# Patient Record
Sex: Female | Born: 1945
Health system: Southern US, Community
[De-identification: ages and names within clinical notes are randomized; demographics above are authoritative.]

## PROBLEM LIST (undated history)

## (undated) DIAGNOSIS — K2270 Barrett's esophagus without dysplasia: Secondary | ICD-10-CM

## (undated) DIAGNOSIS — I10 Essential (primary) hypertension: Secondary | ICD-10-CM

## (undated) DIAGNOSIS — T7840XA Allergy, unspecified, initial encounter: Secondary | ICD-10-CM

## (undated) DIAGNOSIS — M8580 Other specified disorders of bone density and structure, unspecified site: Secondary | ICD-10-CM

## (undated) DIAGNOSIS — F329 Major depressive disorder, single episode, unspecified: Secondary | ICD-10-CM

## (undated) DIAGNOSIS — C801 Malignant (primary) neoplasm, unspecified: Secondary | ICD-10-CM

## (undated) DIAGNOSIS — Z83719 Family history of colon polyps, unspecified: Secondary | ICD-10-CM

## (undated) DIAGNOSIS — Z8371 Family history of colonic polyps: Secondary | ICD-10-CM

## (undated) DIAGNOSIS — K219 Gastro-esophageal reflux disease without esophagitis: Secondary | ICD-10-CM

## (undated) DIAGNOSIS — M1990 Unspecified osteoarthritis, unspecified site: Secondary | ICD-10-CM

## (undated) DIAGNOSIS — M109 Gout, unspecified: Secondary | ICD-10-CM

## (undated) DIAGNOSIS — R7989 Other specified abnormal findings of blood chemistry: Secondary | ICD-10-CM

## (undated) DIAGNOSIS — G4730 Sleep apnea, unspecified: Secondary | ICD-10-CM

## (undated) DIAGNOSIS — B019 Varicella without complication: Secondary | ICD-10-CM

## (undated) DIAGNOSIS — E119 Type 2 diabetes mellitus without complications: Secondary | ICD-10-CM

## (undated) DIAGNOSIS — F32A Depression, unspecified: Secondary | ICD-10-CM

## (undated) HISTORY — DX: Depression, unspecified: F32.A

## (undated) HISTORY — PX: ABDOMINAL HYSTERECTOMY: SHX81

## (undated) HISTORY — DX: Other specified abnormal findings of blood chemistry: R79.89

## (undated) HISTORY — DX: Type 2 diabetes mellitus without complications: E11.9

## (undated) HISTORY — DX: Varicella without complication: B01.9

## (undated) HISTORY — DX: Family history of colonic polyps: Z83.71

## (undated) HISTORY — DX: Gastro-esophageal reflux disease without esophagitis: K21.9

## (undated) HISTORY — PX: KNEE ARTHROSCOPY: SUR90

## (undated) HISTORY — DX: Other specified disorders of bone density and structure, unspecified site: M85.80

## (undated) HISTORY — DX: Family history of colon polyps, unspecified: Z83.719

## (undated) HISTORY — DX: Barrett's esophagus without dysplasia: K22.70

## (undated) HISTORY — DX: Gout, unspecified: M10.9

## (undated) HISTORY — PX: COLONOSCOPY: SHX174

## (undated) HISTORY — DX: Allergy, unspecified, initial encounter: T78.40XA

## (undated) HISTORY — DX: Sleep apnea, unspecified: G47.30

## (undated) HISTORY — PX: POLYPECTOMY: SHX149

## (undated) HISTORY — PX: UPPER GASTROINTESTINAL ENDOSCOPY: SHX188

## (undated) HISTORY — DX: Malignant (primary) neoplasm, unspecified: C80.1

## (undated) HISTORY — DX: Essential (primary) hypertension: I10

## (undated) HISTORY — DX: Unspecified osteoarthritis, unspecified site: M19.90

---
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
## 1998-05-10 ENCOUNTER — Other Ambulatory Visit: Admission: RE | Admit: 1998-05-10 | Discharge: 1998-05-10 | Payer: Self-pay | Admitting: Internal Medicine

## 2005-10-22 HISTORY — PX: BREAST BIOPSY: SHX20

## 2014-01-14 DIAGNOSIS — R7401 Elevation of levels of liver transaminase levels: Secondary | ICD-10-CM | POA: Insufficient documentation

## 2014-05-20 DIAGNOSIS — H524 Presbyopia: Secondary | ICD-10-CM | POA: Insufficient documentation

## 2014-05-20 DIAGNOSIS — H2510 Age-related nuclear cataract, unspecified eye: Secondary | ICD-10-CM | POA: Insufficient documentation

## 2014-06-16 DIAGNOSIS — C4491 Basal cell carcinoma of skin, unspecified: Secondary | ICD-10-CM | POA: Insufficient documentation

## 2016-05-03 DIAGNOSIS — G4733 Obstructive sleep apnea (adult) (pediatric): Secondary | ICD-10-CM | POA: Insufficient documentation

## 2017-03-12 DIAGNOSIS — E559 Vitamin D deficiency, unspecified: Secondary | ICD-10-CM | POA: Insufficient documentation

## 2017-03-12 DIAGNOSIS — E538 Deficiency of other specified B group vitamins: Secondary | ICD-10-CM | POA: Insufficient documentation

## 2019-09-23 ENCOUNTER — Telehealth: Payer: Self-pay

## 2019-09-23 NOTE — Telephone Encounter (Signed)
ROI fax to [facility] for patient records.

## 2019-09-28 ENCOUNTER — Other Ambulatory Visit: Payer: Self-pay

## 2019-09-29 ENCOUNTER — Ambulatory Visit: Payer: Medicare Other | Admitting: Internal Medicine

## 2019-09-29 ENCOUNTER — Encounter: Payer: Self-pay | Admitting: Internal Medicine

## 2019-09-29 VITALS — BP 140/78 | HR 76 | Temp 97.8°F | Wt 243.2 lb

## 2019-09-29 DIAGNOSIS — Z1239 Encounter for other screening for malignant neoplasm of breast: Secondary | ICD-10-CM

## 2019-09-29 DIAGNOSIS — E1165 Type 2 diabetes mellitus with hyperglycemia: Secondary | ICD-10-CM | POA: Diagnosis not present

## 2019-09-29 DIAGNOSIS — E785 Hyperlipidemia, unspecified: Secondary | ICD-10-CM

## 2019-09-29 DIAGNOSIS — Z13820 Encounter for screening for osteoporosis: Secondary | ICD-10-CM | POA: Diagnosis not present

## 2019-09-29 DIAGNOSIS — I10 Essential (primary) hypertension: Secondary | ICD-10-CM | POA: Insufficient documentation

## 2019-09-29 DIAGNOSIS — F339 Major depressive disorder, recurrent, unspecified: Secondary | ICD-10-CM | POA: Insufficient documentation

## 2019-09-29 DIAGNOSIS — E1169 Type 2 diabetes mellitus with other specified complication: Secondary | ICD-10-CM | POA: Insufficient documentation

## 2019-09-29 DIAGNOSIS — IMO0002 Reserved for concepts with insufficient information to code with codable children: Secondary | ICD-10-CM | POA: Insufficient documentation

## 2019-09-29 NOTE — Progress Notes (Signed)
New Patient Office Visit     This visit occurred during the SARS-CoV-2 public health emergency.  Safety protocols were in place, including screening questions prior to the visit, additional usage of staff PPE, and extensive cleaning of exam room while observing appropriate contact time as indicated for disinfecting solutions.    CC/Reason for Visit: Establish care, discuss chronic conditions Previous PCP: Dr. Nicola Girt in [place] Last Visit: Earlier this year  HPI: Diane Decker is a 73 y.o. female who is coming in today for the above mentioned reasons. Past Medical History is significant for: Hypertension that has been well controlled, diabetes that per report has been well controlled on Metformin 1000 mg at bedtime and glipizide 5 mg in the morning with a most recent A1c of 6.9, depression on bupropion 150 mg daily with stable mood.  This was started after a grief reaction when her second husband died 5 years ago.  She would like to start weaning this medication.  She also has a history of hyperlipidemia on 20 mg of atorvastatin, she also has a significant family history and personal history of colon polyps and is on an every 2-year colonoscopy schedule.  She has already scheduled an appointment with [facility].  She is requesting a DEXA scan and a mammogram today that she is due for.  She is here to establish care, she is living in [facility] independent living after she moved from [place] recently.  She has allergies to penicillin which cause anaphylaxis, in regards to surgeries she has had a hysterectomy and a left knee arthroscopy, she drinks alcohol only occasionally, is a remote smoker who quit about 15 years ago.  Her family history significant for father with diabetes paternal grandfather with diabetes and a sister with breast cancer.   Past Medical/Surgical History: Past Medical History:  Diagnosis Date  . Allergy   . Arthritis   . Chicken pox   . Depression   . Diabetes  mellitus without complication ([place])   . Family history of polyps in the colon   . Hypertension     Past Surgical History:  Procedure Laterality Date  . ABDOMINAL HYSTERECTOMY    . BREAST BIOPSY  2007  . TONSILLECTOMY  1949    Social History:  reports that she has never smoked. She has never used smokeless tobacco. She reports that she does not drink alcohol or use drugs.  Allergies: Allergies  Allergen Reactions  . Penicillins Anaphylaxis    Family History:  Family History  Problem Relation Age of Onset  . Arthritis Sister   . Depression Sister   . Hyperlipidemia Sister   . Hypertension Sister   . Alcohol abuse Brother   . Arthritis Brother   . Depression Brother   . Hyperlipidemia Brother   . Hypertension Brother   . Cancer Daughter   . COPD Daughter   . Depression Son   . Arthritis Maternal Grandmother   . Depression Maternal Grandmother   . Hyperlipidemia Maternal Grandmother   . Hypertension Maternal Grandmother   . Alcohol abuse Maternal Grandfather   . Arthritis Maternal Grandfather   . Cancer Maternal Grandfather   . Hyperlipidemia Maternal Grandfather   . Hypertension Maternal Grandfather   . Stroke Maternal Grandfather   . Arthritis Paternal Grandmother   . Hyperlipidemia Paternal Grandmother   . Hypertension Paternal Grandmother   . Arthritis Paternal Grandfather   . Alcohol abuse Paternal Grandfather   . Diabetes Paternal Grandfather   . Hyperlipidemia  Paternal Grandfather   . Heart disease Paternal Grandfather   . Hearing loss Paternal Grandfather   . Stroke Paternal Grandfather      Current Outpatient Medications:  .  Aspirin Buf,CaCarb-MgCarb-MgO, 81 MG TABS, Take by mouth., Disp: , Rfl:  .  atenolol (TENORMIN) 50 MG tablet, Take 50 mg by mouth daily., Disp: , Rfl:  .  atorvastatin (LIPITOR) 20 MG tablet, Take 20 mg by mouth daily., Disp: , Rfl:  .  b complex vitamins tablet, Take by mouth., Disp: , Rfl:  .  buPROPion (WELLBUTRIN XL) 150  MG 24 hr tablet, Take 150 mg by mouth every morning., Disp: , Rfl:  .  Calcium Carbonate-Vitamin D (CALCIUM-D PO), Take by mouth., Disp: , Rfl:  .  erythromycin ophthalmic ointment, Place one application into both eyes every 6 (six) hours for 5 days., Disp: , Rfl:  .  glipiZIDE (GLUCOTROL XL) 5 MG 24 hr tablet, Take 5 mg by mouth every morning., Disp: , Rfl:  .  metFORMIN (GLUCOPHAGE) 500 MG tablet, Take 1,000 mg by mouth at bedtime., Disp: , Rfl:  .  Multiple Vitamin (MULTIVITAMIN) capsule, Take by mouth., Disp: , Rfl:  .  traZODone (DESYREL) 150 MG tablet, TAKE 1/2 TABLET IN THE EVENING, Disp: , Rfl:   Review of Systems:  Constitutional: Denies fever, chills, diaphoresis, appetite change and fatigue.  HEENT: Denies photophobia, eye pain, redness, hearing loss, ear pain, congestion, sore throat, rhinorrhea, sneezing, mouth sores, trouble swallowing, neck pain, neck stiffness and tinnitus.   Respiratory: Denies SOB, DOE, cough, chest tightness,  and wheezing.   Cardiovascular: Denies chest pain, palpitations and leg swelling.  Gastrointestinal: Denies nausea, vomiting, abdominal pain, diarrhea, constipation, blood in stool and abdominal distention.  Genitourinary: Denies dysuria, urgency, frequency, hematuria, flank pain and difficulty urinating.  Endocrine: Denies: hot or cold intolerance, sweats, changes in hair or nails, polyuria, polydipsia. Musculoskeletal: Denies myalgias, back pain, joint swelling, arthralgias and gait problem.  Skin: Denies pallor, rash and wound.  Neurological: Denies dizziness, seizures, syncope, weakness, light-headedness, numbness and headaches.  Hematological: Denies adenopathy. Easy bruising, personal or family bleeding history  Psychiatric/Behavioral: Denies suicidal ideation, mood changes, confusion, nervousness, sleep disturbance and agitation    Physical Exam: Vitals:   09/29/19 1414  BP: 140/78  Pulse: 76  Temp: 97.8 F (36.6 C)  TempSrc: Temporal   SpO2: 97%  Weight: 243 lb 3.2 oz (110.3 kg)   There is no height or weight on file to calculate BMI.  Constitutional: NAD, calm, comfortable, obese Eyes: PERRL, lids and conjunctivae normal, wears corrective lenses ENMT: Mucous membranes are moist.  Respiratory: clear to auscultation bilaterally, no wheezing, no crackles. Normal respiratory effort. No accessory muscle use.  Cardiovascular: Regular rate and rhythm, no murmurs / rubs / gallops.  Trace bilateral lower extremity pitting edema. 2+ pedal pulses.   Abdomen: no tenderness, no masses palpated. No hepatosplenomegaly. Bowel sounds positive.  Musculoskeletal: no clubbing / cyanosis. No joint deformity upper and lower extremities. Good ROM, no contractures. Normal muscle tone.  Skin: no rashes, lesions, ulcers. No induration Neurologic: Grossly intact and nonfocal psychiatric: Normal judgment and insight. Alert and oriented x 3. Normal mood.    Impression and Plan:  Uncontrolled type 2 diabetes mellitus with hyperglycemia ([place]) -A1c in office today is 8.2. -She admits to about a 10 to 15 pound weight gain since arriving in [place]. -Will give her a 42-month period of time to try lifestyle modifications, if we cannot get A1c below 7 we may  need to adjust her diabetic medications.  Screening for osteoporosis  - Plan: DG Bone Density  Encounter for screening for malignant neoplasm of breast, unspecified screening modality  - Plan: MM Digital Screening  Essential hypertension -Blood pressure little above goal today. -Have advised blood for blood pressure monitoring and will follow-up at return visit.  Depression, recurrent ([place]) -Stable today, she would like to stop taking Wellbutrin as she feels she no longer needs it. -We have discussed taking Wellbutrin every other day until she sees me next, if doing well at that point, can continue further titration schedule.  Hyperlipidemia associated with type 2 diabetes mellitus (HCC)  -Check lipids next physical, continue atorvastatin.  Morbid obesity ([place]) -Discussed healthy lifestyle, including increased physical activity and better food choices to promote weight loss.     Patient Instructions  -Nice seeing you today!!  -Schedule follow up in 3 months. Work hard on your [employer] and eating healthy.     Lelon Frohlich, MD [facility]

## 2019-09-29 NOTE — Patient Instructions (Signed)
-  Nice seeing you today!!  -Schedule follow up in 3 months. Work hard on your [employer] and eating healthy.

## 2019-09-30 LAB — HM DIABETES EYE EXAM

## 2019-10-12 ENCOUNTER — Ambulatory Visit: Payer: Medicare Other | Admitting: Podiatry

## 2019-10-12 ENCOUNTER — Other Ambulatory Visit: Payer: Self-pay

## 2019-10-12 ENCOUNTER — Other Ambulatory Visit: Payer: Self-pay | Admitting: *Deleted

## 2019-10-12 ENCOUNTER — Encounter: Payer: Self-pay | Admitting: Podiatry

## 2019-10-12 VITALS — BP 164/78 | HR 63 | Temp 97.4°F | Resp 16

## 2019-10-12 DIAGNOSIS — R609 Edema, unspecified: Secondary | ICD-10-CM | POA: Insufficient documentation

## 2019-10-12 DIAGNOSIS — K2270 Barrett's esophagus without dysplasia: Secondary | ICD-10-CM | POA: Insufficient documentation

## 2019-10-12 DIAGNOSIS — G4700 Insomnia, unspecified: Secondary | ICD-10-CM | POA: Insufficient documentation

## 2019-10-12 DIAGNOSIS — L600 Ingrowing nail: Secondary | ICD-10-CM | POA: Diagnosis not present

## 2019-10-12 DIAGNOSIS — D126 Benign neoplasm of colon, unspecified: Secondary | ICD-10-CM | POA: Insufficient documentation

## 2019-10-12 DIAGNOSIS — Z780 Asymptomatic menopausal state: Secondary | ICD-10-CM | POA: Insufficient documentation

## 2019-10-12 DIAGNOSIS — M810 Age-related osteoporosis without current pathological fracture: Secondary | ICD-10-CM | POA: Insufficient documentation

## 2019-10-12 DIAGNOSIS — S99829A Other specified injuries of unspecified foot, initial encounter: Secondary | ICD-10-CM

## 2019-10-12 NOTE — Patient Instructions (Signed)

## 2019-10-12 NOTE — Progress Notes (Signed)
   Subjective:    Patient ID: Diane Decker, female    DOB: [birth year], 73 y.o.   MRN: [id number]  HPI    Review of Systems  All other systems reviewed and are negative.      Objective:   Physical Exam        Assessment & Plan:

## 2019-10-14 NOTE — Progress Notes (Signed)
Subjective:   Patient ID: Diane Decker, female   DOB: 73 y.o.   MRN: [id number]   HPI Patient presents stating that the left hallux nail has become very damaged and loose and that she rammed it into a door and its been very sore.  Occurred yesterday patient is a diabetic under reasonable control and does not smoke   Review of Systems  All other systems reviewed and are negative.       Objective:  Physical Exam Vitals and nursing note reviewed.  Constitutional:      Appearance: She is well-developed.  Pulmonary:     Effort: Pulmonary effort is normal.  Musculoskeletal:        General: Normal range of motion.  Skin:    General: Skin is warm.  Neurological:     Mental Status: She is alert.     Neurovascular status was found to be intact muscle strength was found to be adequate range of motion within normal limits.  Patient Diane Decker traumatized left hallux no longer thickened dystrophic and loose from the bed with no proximal attachment noted.  There is mild redness in the area but no active drainage     Assessment:  Traumatized left hallux nail bed secondary to injury with nail not attached properly to the underlying bed     Plan:  H&P reviewed condition at great length going over different treatment options.  I recommended nail removal allowing new nail to regrow and I explained procedure risk patient wants procedure.  I infiltrated the left hallux 60 mg like Marcaine mixture sterile prep done and using sterile instrumentation I remove the hallux nail flushed and cleaned all tissue and applied sterile dressing.  Gave instructions on soaks and reappoint

## 2019-10-22 ENCOUNTER — Encounter: Payer: Self-pay | Admitting: Internal Medicine

## 2019-11-19 ENCOUNTER — Ambulatory Visit: Payer: Medicare Other

## 2019-12-28 ENCOUNTER — Ambulatory Visit
Admission: RE | Admit: 2019-12-28 | Discharge: 2019-12-28 | Disposition: A | Discharge status: Home / Self Care | Payer: Medicare PPO | Source: Ambulatory Visit | Attending: Internal Medicine | Admitting: Internal Medicine

## 2019-12-28 ENCOUNTER — Other Ambulatory Visit: Payer: Self-pay

## 2019-12-28 DIAGNOSIS — Z1239 Encounter for other screening for malignant neoplasm of breast: Secondary | ICD-10-CM

## 2019-12-28 DIAGNOSIS — Z13820 Encounter for screening for osteoporosis: Secondary | ICD-10-CM

## 2019-12-29 ENCOUNTER — Ambulatory Visit: Payer: Medicare Other | Admitting: Internal Medicine

## 2019-12-31 ENCOUNTER — Other Ambulatory Visit: Payer: Self-pay | Admitting: Family

## 2020-01-06 ENCOUNTER — Ambulatory Visit: Payer: Medicare PPO | Admitting: Internal Medicine

## 2020-01-06 ENCOUNTER — Other Ambulatory Visit: Payer: Self-pay

## 2020-01-06 VITALS — BP 124/84 | HR 67 | Temp 97.2°F | Ht 64.0 in | Wt 248.3 lb

## 2020-01-06 DIAGNOSIS — F339 Major depressive disorder, recurrent, unspecified: Secondary | ICD-10-CM | POA: Diagnosis not present

## 2020-01-06 DIAGNOSIS — M8580 Other specified disorders of bone density and structure, unspecified site: Secondary | ICD-10-CM

## 2020-01-06 DIAGNOSIS — D126 Benign neoplasm of colon, unspecified: Secondary | ICD-10-CM

## 2020-01-06 DIAGNOSIS — E785 Hyperlipidemia, unspecified: Secondary | ICD-10-CM

## 2020-01-06 DIAGNOSIS — E559 Vitamin D deficiency, unspecified: Secondary | ICD-10-CM

## 2020-01-06 DIAGNOSIS — Z1283 Encounter for screening for malignant neoplasm of skin: Secondary | ICD-10-CM

## 2020-01-06 DIAGNOSIS — E1169 Type 2 diabetes mellitus with other specified complication: Secondary | ICD-10-CM

## 2020-01-06 DIAGNOSIS — E1165 Type 2 diabetes mellitus with hyperglycemia: Secondary | ICD-10-CM | POA: Diagnosis not present

## 2020-01-06 DIAGNOSIS — I10 Essential (primary) hypertension: Secondary | ICD-10-CM

## 2020-01-06 DIAGNOSIS — E538 Deficiency of other specified B group vitamins: Secondary | ICD-10-CM

## 2020-01-06 LAB — POCT GLYCOSYLATED HEMOGLOBIN (HGB A1C): Hemoglobin A1C: 9.7 % — AB (ref 4.0–5.6)

## 2020-01-06 MED ORDER — METFORMIN HCL 500 MG PO TABS: 1000.0000 mg | ORAL_TABLET | Freq: Two times a day (BID) | ORAL | 2 refills | Status: DC

## 2020-01-06 NOTE — Progress Notes (Signed)
Established Patient Office Visit     This visit occurred during the SARS-CoV-2 public health emergency.  Safety protocols were in place, including screening questions prior to the visit, additional usage of staff PPE, and extensive cleaning of exam room while observing appropriate contact time as indicated for disinfecting solutions.    CC/Reason for Visit: Follow-up chronic conditions  HPI: Diane Decker is a 74 y.o. female who is coming in today for the above mentioned reasons. Past Medical History is significant for: Hypertension that has been well controlled, Metformin not well controlled with a recent A1c of 8.2 in December, depression on bupropion which she has discontinued with stable mood.  She has a history of hyperlipidemia on atorvastatin.  She has a personal history of colon polyps and is on an every 2-year colonoscopy schedule, needs GI referral.  She had a recent DEXA scan that showed osteopenia.  She has questions about numerous skin moles.   Past Medical/Surgical History: Past Medical History:  Diagnosis Date  . Allergy   . Arthritis   . Chicken pox   . Depression   . Diabetes mellitus without complication ([place])   . Family history of polyps in the colon   . Hypertension     Past Surgical History:  Procedure Laterality Date  . ABDOMINAL HYSTERECTOMY    . BREAST BIOPSY  2007  . TONSILLECTOMY  1949    Social History:  reports that she has never smoked. She has never used smokeless tobacco. She reports that she does not drink alcohol or use drugs.  Allergies: Allergies  Allergen Reactions  . Penicillins Anaphylaxis  . Lisinopril Other (See Comments) and Swelling  . Losartan Potassium Swelling  . Losartan Nausea And Vomiting    Family History:  Family History  Problem Relation Age of Onset  . Arthritis Sister   . Depression Sister   . Hyperlipidemia Sister   . Hypertension Sister   . Alcohol abuse Brother   . Arthritis Brother   .  Depression Brother   . Hyperlipidemia Brother   . Hypertension Brother   . Cancer Daughter   . COPD Daughter   . Depression Son   . Arthritis Maternal Grandmother   . Depression Maternal Grandmother   . Hyperlipidemia Maternal Grandmother   . Hypertension Maternal Grandmother   . Alcohol abuse Maternal Grandfather   . Arthritis Maternal Grandfather   . Cancer Maternal Grandfather   . Hyperlipidemia Maternal Grandfather   . Hypertension Maternal Grandfather   . Stroke Maternal Grandfather   . Arthritis Paternal Grandmother   . Hyperlipidemia Paternal Grandmother   . Hypertension Paternal Grandmother   . Arthritis Paternal Grandfather   . Alcohol abuse Paternal Grandfather   . Diabetes Paternal Grandfather   . Hyperlipidemia Paternal Grandfather   . Heart disease Paternal Grandfather   . Hearing loss Paternal Grandfather   . Stroke Paternal Grandfather      Current Outpatient Medications:  .  Aspirin Buf,CaCarb-MgCarb-MgO, 81 MG TABS, Take by mouth., Disp: , Rfl:  .  atenolol (TENORMIN) 50 MG tablet, Take 50 mg by mouth daily., Disp: , Rfl:  .  atorvastatin (LIPITOR) 20 MG tablet, Take 20 mg by mouth daily., Disp: , Rfl:  .  b complex vitamins tablet, Take by mouth., Disp: , Rfl:  .  Calcium Carbonate-Vitamin D (CALCIUM-D PO), Take by mouth., Disp: , Rfl:  .  erythromycin ophthalmic ointment, Place one application into both eyes every 6 (six) hours for 5 days.,  Disp: , Rfl:  .  glipiZIDE (GLUCOTROL XL) 5 MG 24 hr tablet, Take 5 mg by mouth every morning., Disp: , Rfl:  .  metFORMIN (GLUCOPHAGE) 500 MG tablet, Take 2 tablets (1,000 mg total) by mouth 2 (two) times daily with a meal., Disp: 120 tablet, Rfl: 2 .  Multiple Vitamin (MULTIVITAMIN) capsule, Take by mouth., Disp: , Rfl:  .  traZODone (DESYREL) 150 MG tablet, TAKE 1/2 TABLET IN THE EVENING, Disp: , Rfl:  .  XIIDRA 5 % SOLN, , Disp: , Rfl:   Review of Systems:  Constitutional: Denies fever, chills, diaphoresis,  appetite change and fatigue.  HEENT: Denies photophobia, eye pain, redness, hearing loss, ear pain, congestion, sore throat, rhinorrhea, sneezing, mouth sores, trouble swallowing, neck pain, neck stiffness and tinnitus.   Respiratory: Denies SOB, DOE, cough, chest tightness,  and wheezing.   Cardiovascular: Denies chest pain, palpitations and leg swelling.  Gastrointestinal: Denies nausea, vomiting, abdominal pain, diarrhea, constipation, blood in stool and abdominal distention.  Genitourinary: Denies dysuria, urgency, frequency, hematuria, flank pain and difficulty urinating.  Endocrine: Denies: hot or cold intolerance, sweats, changes in hair or nails, polyuria, polydipsia. Musculoskeletal: Denies myalgias, back pain, joint swelling, arthralgias and gait problem.  Skin: Denies pallor, rash and wound.  Neurological: Denies dizziness, seizures, syncope, weakness, light-headedness, numbness and headaches.  Hematological: Denies adenopathy. Easy bruising, personal or family bleeding history  Psychiatric/Behavioral: Denies suicidal ideation, mood changes, confusion, nervousness, sleep disturbance and agitation    Physical Exam: Vitals:   01/06/20 0940  BP: 124/84  Pulse: 67  Temp: (!) 97.2 F (36.2 C)  TempSrc: Temporal  SpO2: 96%  Weight: 248 lb 4.8 oz (112.6 kg)  Height: 5\' 4"  (1.626 m)    Body mass index is 42.62 kg/m.   Constitutional: NAD, calm, comfortable Eyes: PERRL, lids and conjunctivae normal, wears corrective lenses ENMT: Mucous membranes are moist.  Respiratory: clear to auscultation bilaterally, no wheezing, no crackles. Normal respiratory effort. No accessory muscle use.  Cardiovascular: Regular rate and rhythm, no murmurs / rubs / gallops. No extremity edema. 2+ pedal pulses.  Psychiatric: Normal judgment and insight. Alert and oriented x 3. Normal mood.    Impression and Plan:  Uncontrolled type 2 diabetes mellitus with hyperglycemia (HCC)  A1c has worsened to  9.7 today. -Increase Metformin to 1000 mg twice daily. -We have discussed diet and exercise. -Follow-up in 3 months.    Depression, recurrent ([place]) -She has completely weaned off Wellbutrin, mood is stable.  Morbid obesity ([place]) Body mass index is 42.62 kg/m. -Discussed healthy lifestyle, including increased physical activity and better food choices to promote weight loss.  Hyperlipidemia associated with type 2 diabetes mellitus (HCC) -Continue atorvastatin, check LDL with next physical  Essential hypertension -Well-controlled on current regimen.  Adenomatous polyp of colon, unspecified part of colon -Referral to GI.  Osteopenia, unspecified location -She will maximize calcium and vitamin D.  Repeat DEXA in 2 years.  Vitamin D deficiency B12 deficiency -Check levels when she returns for physical.    Patient Instructions  -Nice seeing you today!!  -Increase metformin to 1000 mg twice daily.  -Work on diet and exercise.  -Get tetanus and shingles vaccines at your pharmacy.  -Referrals to GI and dermatology today.  -Schedule a 3 month follow up.   Diabetes Mellitus and Nutrition, Adult When you have diabetes (diabetes mellitus), it is very important to have healthy eating habits because your blood sugar (glucose) levels are greatly affected by what you eat and drink.  Eating healthy foods in the appropriate amounts, at about the same times every day, can help you:  Control your blood glucose.  Lower your risk of heart disease.  Improve your blood pressure.  Reach or maintain a healthy weight. Every person with diabetes is different, and each person has different needs for a meal plan. Your health care provider may recommend that you work with a diet and nutrition specialist (dietitian) to make a meal plan that is best for you. Your meal plan may vary depending on factors such as:  The calories you need.  The medicines you take.  Your weight.  Your blood  glucose, blood pressure, and cholesterol levels.  Your activity level.  Other health conditions you have, such as heart or kidney disease. How do carbohydrates affect me? Carbohydrates, also called carbs, affect your blood glucose level more than any other type of food. Eating carbs naturally raises the amount of glucose in your blood. Carb counting is a method for keeping track of how many carbs you eat. Counting carbs is important to keep your blood glucose at a healthy level, especially if you use insulin or take certain oral diabetes medicines. It is important to know how many carbs you can safely have in each meal. This is different for every person. Your dietitian can help you calculate how many carbs you should have at each meal and for each snack. Foods that contain carbs include:  Bread, cereal, rice, pasta, and crackers.  Potatoes and corn.  Peas, beans, and lentils.  Milk and yogurt.  Fruit and juice.  Desserts, such as cakes, cookies, ice cream, and candy. How does alcohol affect me? Alcohol can cause a sudden decrease in blood glucose (hypoglycemia), especially if you use insulin or take certain oral diabetes medicines. Hypoglycemia can be a life-threatening condition. Symptoms of hypoglycemia (sleepiness, dizziness, and confusion) are similar to symptoms of having too much alcohol. If your health care provider says that alcohol is safe for you, follow these guidelines:  Limit alcohol intake to no more than 1 drink per day for nonpregnant women and 2 drinks per day for men. One drink equals 12 oz of beer, 5 oz of wine, or 1 oz of hard liquor.  Do not drink on an empty stomach.  Keep yourself hydrated with water, diet soda, or unsweetened iced tea.  Keep in mind that regular soda, juice, and other mixers may contain a lot of sugar and must be counted as carbs. What are tips for following this plan?  Reading food labels  Start by checking the serving size on the  "Nutrition Facts" label of packaged foods and drinks. The amount of calories, carbs, fats, and other nutrients listed on the label is based on one serving of the item. Many items contain more than one serving per package.  Check the total grams (g) of carbs in one serving. You can calculate the number of servings of carbs in one serving by dividing the total carbs by 15. For example, if a food has 30 g of total carbs, it would be equal to 2 servings of carbs.  Check the number of grams (g) of saturated and trans fats in one serving. Choose foods that have low or no amount of these fats.  Check the number of milligrams (mg) of salt (sodium) in one serving. Most people should limit total sodium intake to less than 2,300 mg per day.  Always check the nutrition information of foods labeled as "low-fat" or "  nonfat". These foods may be higher in added sugar or refined carbs and should be avoided.  Talk to your dietitian to identify your daily goals for nutrients listed on the label. Shopping  Avoid buying canned, premade, or processed foods. These foods tend to be high in fat, sodium, and added sugar.  Shop around the outside edge of the grocery store. This includes fresh fruits and vegetables, bulk grains, fresh meats, and fresh dairy. Cooking  Use low-heat cooking methods, such as baking, instead of high-heat cooking methods like deep frying.  Cook using healthy oils, such as olive, canola, or sunflower oil.  Avoid cooking with butter, cream, or high-fat meats. Meal planning  Eat meals and snacks regularly, preferably at the same times every day. Avoid going long periods of time without eating.  Eat foods high in fiber, such as fresh fruits, vegetables, beans, and whole grains. Talk to your dietitian about how many servings of carbs you can eat at each meal.  Eat 4-6 ounces (oz) of lean protein each day, such as lean meat, chicken, fish, eggs, or tofu. One oz of lean protein is equal  to: ? 1 oz of meat, chicken, or fish. ? 1 egg. ?  cup of tofu.  Eat some foods each day that contain healthy fats, such as avocado, nuts, seeds, and fish. Lifestyle  Check your blood glucose regularly.  Exercise regularly as told by your health care provider. This may include: ? 150 minutes of moderate-intensity or vigorous-intensity exercise each week. This could be brisk walking, biking, or water aerobics. ? Stretching and doing strength exercises, such as yoga or weightlifting, at least 2 times a week.  Take medicines as told by your health care provider.  Do not use any products that contain nicotine or tobacco, such as cigarettes and e-cigarettes. If you need help quitting, ask your health care provider.  Work with a Social worker or diabetes educator to identify strategies to manage stress and any emotional and social challenges. Questions to ask a health care provider  Do I need to meet with a diabetes educator?  Do I need to meet with a dietitian?  What number can I call if I have questions?  When are the best times to check my blood glucose? Where to find more information:  American Diabetes Association: diabetes.org  Academy of Nutrition and Dietetics: www.eatright.[employer] of Diabetes and Digestive and Kidney Diseases (NIH): [employer] Summary  A healthy meal plan will help you control your blood glucose and maintain a healthy lifestyle.  Working with a diet and nutrition specialist (dietitian) can help you make a meal plan that is best for you.  Keep in mind that carbohydrates (carbs) and alcohol have immediate effects on your blood glucose levels. It is important to count carbs and to use alcohol carefully. This information is not intended to replace advice given to you by your health care provider. Make sure you discuss any questions you have with your health care provider. Document Revised: 09/20/2017 Document Reviewed: 11/12/2016 Elsevier  Patient Education  2020 [place], MD [facility]

## 2020-01-06 NOTE — Patient Instructions (Signed)
-Nice seeing you today!!  -Increase metformin to 1000 mg twice daily.  -Work on diet and exercise.  -Get tetanus and shingles vaccines at your pharmacy.  -Referrals to GI and dermatology today.  -Schedule a 3 month follow up.   Diabetes Mellitus and Nutrition, Adult When you have diabetes (diabetes mellitus), it is very important to have healthy eating habits because your blood sugar (glucose) levels are greatly affected by what you eat and drink. Eating healthy foods in the appropriate amounts, at about the same times every day, can help you:  Control your blood glucose.  Lower your risk of heart disease.  Improve your blood pressure.  Reach or maintain a healthy weight. Every person with diabetes is different, and each person has different needs for a meal plan. Your health care provider may recommend that you work with a diet and nutrition specialist (dietitian) to make a meal plan that is best for you. Your meal plan may vary depending on factors such as:  The calories you need.  The medicines you take.  Your weight.  Your blood glucose, blood pressure, and cholesterol levels.  Your activity level.  Other health conditions you have, such as heart or kidney disease. How do carbohydrates affect me? Carbohydrates, also called carbs, affect your blood glucose level more than any other type of food. Eating carbs naturally raises the amount of glucose in your blood. Carb counting is a method for keeping track of how many carbs you eat. Counting carbs is important to keep your blood glucose at a healthy level, especially if you use insulin or take certain oral diabetes medicines. It is important to know how many carbs you can safely have in each meal. This is different for every person. Your dietitian can help you calculate how many carbs you should have at each meal and for each snack. Foods that contain carbs include:  Bread, cereal, rice, pasta, and crackers.  Potatoes and  corn.  Peas, beans, and lentils.  Milk and yogurt.  Fruit and juice.  Desserts, such as cakes, cookies, ice cream, and candy. How does alcohol affect me? Alcohol can cause a sudden decrease in blood glucose (hypoglycemia), especially if you use insulin or take certain oral diabetes medicines. Hypoglycemia can be a life-threatening condition. Symptoms of hypoglycemia (sleepiness, dizziness, and confusion) are similar to symptoms of having too much alcohol. If your health care provider says that alcohol is safe for you, follow these guidelines:  Limit alcohol intake to no more than 1 drink per day for nonpregnant women and 2 drinks per day for men. One drink equals 12 oz of beer, 5 oz of wine, or 1 oz of hard liquor.  Do not drink on an empty stomach.  Keep yourself hydrated with water, diet soda, or unsweetened iced tea.  Keep in mind that regular soda, juice, and other mixers may contain a lot of sugar and must be counted as carbs. What are tips for following this plan?  Reading food labels  Start by checking the serving size on the "Nutrition Facts" label of packaged foods and drinks. The amount of calories, carbs, fats, and other nutrients listed on the label is based on one serving of the item. Many items contain more than one serving per package.  Check the total grams (g) of carbs in one serving. You can calculate the number of servings of carbs in one serving by dividing the total carbs by 15. For example, if a food has 30 g  of total carbs, it would be equal to 2 servings of carbs.  Check the number of grams (g) of saturated and trans fats in one serving. Choose foods that have low or no amount of these fats.  Check the number of milligrams (mg) of salt (sodium) in one serving. Most people should limit total sodium intake to less than 2,300 mg per day.  Always check the nutrition information of foods labeled as "low-fat" or "nonfat". These foods may be higher in added sugar or  refined carbs and should be avoided.  Talk to your dietitian to identify your daily goals for nutrients listed on the label. Shopping  Avoid buying canned, premade, or processed foods. These foods tend to be high in fat, sodium, and added sugar.  Shop around the outside edge of the grocery store. This includes fresh fruits and vegetables, bulk grains, fresh meats, and fresh dairy. Cooking  Use low-heat cooking methods, such as baking, instead of high-heat cooking methods like deep frying.  Cook using healthy oils, such as olive, canola, or sunflower oil.  Avoid cooking with butter, cream, or high-fat meats. Meal planning  Eat meals and snacks regularly, preferably at the same times every day. Avoid going long periods of time without eating.  Eat foods high in fiber, such as fresh fruits, vegetables, beans, and whole grains. Talk to your dietitian about how many servings of carbs you can eat at each meal.  Eat 4-6 ounces (oz) of lean protein each day, such as lean meat, chicken, fish, eggs, or tofu. One oz of lean protein is equal to: ? 1 oz of meat, chicken, or fish. ? 1 egg. ?  cup of tofu.  Eat some foods each day that contain healthy fats, such as avocado, nuts, seeds, and fish. Lifestyle  Check your blood glucose regularly.  Exercise regularly as told by your health care provider. This may include: ? 150 minutes of moderate-intensity or vigorous-intensity exercise each week. This could be brisk walking, biking, or water aerobics. ? Stretching and doing strength exercises, such as yoga or weightlifting, at least 2 times a week.  Take medicines as told by your health care provider.  Do not use any products that contain nicotine or tobacco, such as cigarettes and e-cigarettes. If you need help quitting, ask your health care provider.  Work with a Social worker or diabetes educator to identify strategies to manage stress and any emotional and social challenges. Questions to ask a  health care provider  Do I need to meet with a diabetes educator?  Do I need to meet with a dietitian?  What number can I call if I have questions?  When are the best times to check my blood glucose? Where to find more information:  American Diabetes Association: diabetes.org  Academy of Nutrition and Dietetics: www.eatright.[employer] of Diabetes and Digestive and Kidney Diseases (NIH): [employer] Summary  A healthy meal plan will help you control your blood glucose and maintain a healthy lifestyle.  Working with a diet and nutrition specialist (dietitian) can help you make a meal plan that is best for you.  Keep in mind that carbohydrates (carbs) and alcohol have immediate effects on your blood glucose levels. It is important to count carbs and to use alcohol carefully. This information is not intended to replace advice given to you by your health care provider. Make sure you discuss any questions you have with your health care provider. Document Revised: 09/20/2017 Document Reviewed: 11/12/2016 Elsevier Patient Education  2020 Elsevier Inc.  

## 2020-01-14 ENCOUNTER — Telehealth: Payer: Self-pay

## 2020-01-14 NOTE — Telephone Encounter (Signed)
Rec'd from [place] forwarded 28 pages to GI Provider Historical

## 2020-01-18 ENCOUNTER — Telehealth: Payer: Self-pay | Admitting: Gastroenterology

## 2020-01-18 NOTE — Telephone Encounter (Signed)
Dr. Havery Moros, (D.O.D)   We received a referral for this patient  for an adenomatous polyp of colon. We have received her records for your review.  Will you accept this patient?

## 2020-01-19 NOTE — Telephone Encounter (Signed)
I will accept this patient. I am out of the office this week and next week so will not be able to review records during that time but this does not sound urgent and will review when I am back in the office. Thanks

## 2020-01-28 ENCOUNTER — Ambulatory Visit: Payer: Self-pay | Admitting: Internal Medicine

## 2020-01-29 ENCOUNTER — Encounter: Payer: Self-pay | Admitting: Gastroenterology

## 2020-02-03 ENCOUNTER — Encounter: Payer: Self-pay | Admitting: Gastroenterology

## 2020-03-02 ENCOUNTER — Other Ambulatory Visit: Payer: Self-pay

## 2020-03-02 ENCOUNTER — Ambulatory Visit (AMBULATORY_SURGERY_CENTER): Payer: Self-pay | Admitting: *Deleted

## 2020-03-02 VITALS — Temp 97.3°F | Ht 64.0 in | Wt 250.0 lb

## 2020-03-02 DIAGNOSIS — Z86010 Personal history of colonic polyps: Secondary | ICD-10-CM

## 2020-03-02 DIAGNOSIS — K2270 Barrett's esophagus without dysplasia: Secondary | ICD-10-CM

## 2020-03-02 NOTE — Progress Notes (Signed)
No egg or soy allergy known to patient  No issues with past sedation with any surgeries  or procedures, no intubation problems  No diet pills per patient No home 02 use per patient  No blood thinners per patient  Pt denies issues with constipation  No A fib or A flutter  EMMI video sent to pt's e mail   12-16-2019 comp. covid vaccines   Due to the COVID-19 pandemic we are asking patients to follow these guidelines. Please only bring one care partner. Please be aware that your care partner may wait in the car in the parking lot or if they feel like they will be too hot to wait in the car, they may wait in the lobby on the 4th floor. All care partners are required to wear a mask the entire time (we do not have any that we can provide them), they need to practice social distancing, and we will do a Covid check for all patient's and care partners when you arrive. Also we will check their temperature and your temperature. If the care partner waits in their car they need to stay in the parking lot the entire time and we will call them on their cell phone when the patient is ready for discharge so they can bring the car to the front of the building. Also all patient's will need to wear a mask into building.

## 2020-03-08 ENCOUNTER — Ambulatory Visit: Payer: Medicare PPO | Admitting: Gastroenterology

## 2020-03-08 DIAGNOSIS — D0471 Carcinoma in situ of skin of right lower limb, including hip: Secondary | ICD-10-CM | POA: Diagnosis not present

## 2020-03-08 DIAGNOSIS — L570 Actinic keratosis: Secondary | ICD-10-CM | POA: Diagnosis not present

## 2020-03-08 DIAGNOSIS — L821 Other seborrheic keratosis: Secondary | ICD-10-CM | POA: Diagnosis not present

## 2020-03-08 DIAGNOSIS — C44712 Basal cell carcinoma of skin of right lower limb, including hip: Secondary | ICD-10-CM | POA: Diagnosis not present

## 2020-03-08 DIAGNOSIS — L820 Inflamed seborrheic keratosis: Secondary | ICD-10-CM | POA: Diagnosis not present

## 2020-03-14 ENCOUNTER — Other Ambulatory Visit: Payer: Self-pay

## 2020-03-14 ENCOUNTER — Encounter: Payer: Self-pay | Admitting: Gastroenterology

## 2020-03-14 ENCOUNTER — Ambulatory Visit (AMBULATORY_SURGERY_CENTER): Payer: Medicare PPO | Admitting: Gastroenterology

## 2020-03-14 VITALS — BP 155/61 | HR 64 | Resp 16 | Ht 64.0 in | Wt 250.0 lb

## 2020-03-14 DIAGNOSIS — K449 Diaphragmatic hernia without obstruction or gangrene: Secondary | ICD-10-CM

## 2020-03-14 DIAGNOSIS — D124 Benign neoplasm of descending colon: Secondary | ICD-10-CM | POA: Diagnosis not present

## 2020-03-14 DIAGNOSIS — K208 Other esophagitis without bleeding: Secondary | ICD-10-CM | POA: Diagnosis not present

## 2020-03-14 DIAGNOSIS — D123 Benign neoplasm of transverse colon: Secondary | ICD-10-CM

## 2020-03-14 DIAGNOSIS — D120 Benign neoplasm of cecum: Secondary | ICD-10-CM

## 2020-03-14 DIAGNOSIS — K2950 Unspecified chronic gastritis without bleeding: Secondary | ICD-10-CM | POA: Diagnosis not present

## 2020-03-14 DIAGNOSIS — D122 Benign neoplasm of ascending colon: Secondary | ICD-10-CM | POA: Diagnosis not present

## 2020-03-14 DIAGNOSIS — Z86010 Personal history of colonic polyps: Secondary | ICD-10-CM

## 2020-03-14 DIAGNOSIS — D125 Benign neoplasm of sigmoid colon: Secondary | ICD-10-CM | POA: Diagnosis not present

## 2020-03-14 DIAGNOSIS — K2270 Barrett's esophagus without dysplasia: Secondary | ICD-10-CM | POA: Diagnosis not present

## 2020-03-14 MED ORDER — SODIUM CHLORIDE 0.9 % IV SOLN: 500.0000 mL | Freq: Once | INTRAVENOUS | Status: DC

## 2020-03-14 NOTE — Op Note (Signed)
[facility] Patient Name: Diane Decker Procedure Date: 03/14/2020 7:57 AM MRN: [id number] Endoscopist: Remo Lipps P. Havery Moros , MD Age: 74 Referring MD:  Date of Birth: [birth year] Gender: Female Account #: [id number] Procedure:                Colonoscopy Indications:              High risk colon cancer surveillance: Personal                            history of colonic polyps (last colonoscopy 2018                            with numerous adenomas) Medicines:                Monitored Anesthesia Care Procedure:                Pre-Anesthesia Assessment:                           - Prior to the procedure, a History and Physical                            was performed, and patient medications and                            allergies were reviewed. The patient's tolerance of                            previous anesthesia was also reviewed. The risks                            and benefits of the procedure and the sedation                            options and risks were discussed with the patient.                            All questions were answered, and informed consent                            was obtained. Prior Anticoagulants: The patient has                            taken no previous anticoagulant or antiplatelet                            agents. ASA Grade Assessment: III - A patient with                            severe systemic disease. After reviewing the risks                            and benefits, the patient was deemed in  satisfactory condition to undergo the procedure.                           After obtaining informed consent, the colonoscope                            was passed under direct vision. Throughout the                            procedure, the patient's blood pressure, pulse, and                            oxygen saturations were monitored continuously. The                            Colonoscope was introduced  through the anus and                            advanced to the the cecum, identified by                            appendiceal orifice and ileocecal valve. The                            colonoscopy was performed without difficulty. The                            patient tolerated the procedure well. The quality                            of the bowel preparation was good. The ileocecal                            valve, appendiceal orifice, and rectum were                            photographed. Scope In: 8:14:42 AM Scope Out: 8:40:23 AM Scope Withdrawal Time: 0 hours 19 minutes 40 seconds  Total Procedure Duration: 0 hours 25 minutes 41 seconds  Findings:                 The perianal and digital rectal examinations were                            normal.                           Two sessile polyps were found in the cecum. The                            polyps were 2 to 3 mm in size. These polyps were                            removed with a cold snare. Resection and retrieval  were complete.                           A 3 to 4 mm polyp was found in the ascending colon.                            The polyp was sessile. The polyp was removed with a                            cold snare. Resection and retrieval were complete.                           Ten sessile polyps were found in the transverse                            colon. The polyps were 3 to 6 mm in size. These                            polyps were removed with a cold snare. Resection                            and retrieval were complete.                           Four sessile polyps were found in the descending                            colon. The polyps were 3 to 5 mm in size. These                            polyps were removed with a cold snare. Resection                            and retrieval were complete.                           A 4 mm polyp was found in the sigmoid colon. The                             polyp was sessile. The polyp was removed with a                            cold snare. Resection and retrieval were complete.                           A few small-mouthed diverticula were found in the                            sigmoid colon.                           The exam was otherwise without abnormality. Complications:  No immediate complications. Estimated blood loss:                            Minimal. Estimated Blood Loss:     Estimated blood loss was minimal. Impression:               - Two 2 to 3 mm polyps in the cecum, removed with a                            cold snare. Resected and retrieved.                           - One 3 to 4 mm polyp in the ascending colon,                            removed with a cold snare. Resected and retrieved.                           - Ten 3 to 6 mm polyps in the transverse colon,                            removed with a cold snare. Resected and retrieved.                           - Four 3 to 5 mm polyps in the descending colon,                            removed with a cold snare. Resected and retrieved.                           - One 4 mm polyp in the sigmoid colon, removed with                            a cold snare. Resected and retrieved.                           - Diverticulosis in the sigmoid colon.                           - The examination was otherwise normal. Recommendation:           - Patient has a contact number available for                            emergencies. The signs and symptoms of potential                            delayed complications were discussed with the                            patient. Return to normal activities tomorrow.  Written discharge instructions were provided to the                            patient.                           - Resume previous diet.                           - Continue present medications.                           -  Await pathology results.                           - Assuming most of these polyps are adenomatous,                            consideration for genetic testing for polyposis                            syndrome Remo Lipps P. Gilmore List, MD 03/14/2020 8:50:09 AM This report has been signed electronically.

## 2020-03-14 NOTE — Progress Notes (Signed)
A/ox3, pleased with MAC, report to RN 

## 2020-03-14 NOTE — Op Note (Signed)
[facility] Patient Name: Diane Decker Procedure Date: 03/14/2020 7:57 AM MRN: [id number] Endoscopist: Remo Lipps P. Havery Moros , MD Age: 74 Referring MD:  Date of Birth: 24-Apr-1946 Gender: Female Account #: [id number] Procedure:                Upper GI endoscopy Indications:              Follow-up of Barrett's esophagus - reported short                            segment of nondysplastic Barrett's in 2018 Medicines:                Monitored Anesthesia Care Procedure:                Pre-Anesthesia Assessment:                           - Prior to the procedure, a History and Physical                            was performed, and patient medications and                            allergies were reviewed. The patient's tolerance of                            previous anesthesia was also reviewed. The risks                            and benefits of the procedure and the sedation                            options and risks were discussed with the patient.                            All questions were answered, and informed consent                            was obtained. Prior Anticoagulants: The patient has                            taken no previous anticoagulant or antiplatelet                            agents. ASA Grade Assessment: III - A patient with                            severe systemic disease. After reviewing the risks                            and benefits, the patient was deemed in                            satisfactory condition to undergo the procedure.  After obtaining informed consent, the endoscope was                            passed under direct vision. Throughout the                            procedure, the patient's blood pressure, pulse, and                            oxygen saturations were monitored continuously. The                            Endoscope was introduced through the mouth, and   advanced to the second part of duodenum. The upper                            GI endoscopy was accomplished without difficulty.                            The patient tolerated the procedure well. Scope In: Scope Out: Findings:                 Esophagogastric landmarks were identified: the                            Z-line was found at 34 cm, the gastroesophageal                            junction was found at 34 cm and the upper extent of                            the gastric folds was found at 36 cm from the                            incisors.                           A 2 cm hiatal hernia was present.                           The Z-line was irregular with small islands of                            salmon colored mucosa. Biopsies were taken with a                            cold forceps for histology.                           The exam of the esophagus was otherwise normal.                           The entire examined stomach was normal.  The duodenal bulb and second portion of the                            duodenum were normal. Complications:            No immediate complications. Estimated blood loss:                            Minimal. Estimated Blood Loss:     Estimated blood loss was minimal.                           ] Impression:               - Esophagogastric landmarks identified.                           - 2 cm hiatal hernia.                           - Z-line irregular. Biopsied.                           - Normal stomach.                           - Normal duodenal bulb and second portion of the                            duodenum. Recommendation:           - Patient has a contact number available for                            emergencies. The signs and symptoms of potential                            delayed complications were discussed with the                            patient. Return to normal activities tomorrow.                             Written discharge instructions were provided to the                            patient.                           - Resume previous diet.                           - Continue present medications.                           - Await pathology results. Remo Lipps P. Tyreke Kaeser, MD 03/14/2020 8:53:08 AM This report has been signed electronically.

## 2020-03-14 NOTE — Progress Notes (Signed)
Called to room to assist during endoscopic procedure.  Patient ID and intended procedure confirmed with present staff. Received instructions for my participation in the procedure from the performing physician.  

## 2020-03-14 NOTE — Progress Notes (Signed)
Pt's states no medical or surgical changes since previsit or office visit. 

## 2020-03-14 NOTE — Patient Instructions (Signed)
Handouts provided on hiatal hernia, polyps and diverticulosis.  YOU HAD AN ENDOSCOPIC PROCEDURE TODAY AT [facility]:   Refer to the procedure report that was given to you for any specific questions about what was found during the examination.  If the procedure report does not answer your questions, please call your gastroenterologist to clarify.  If you requested that your care partner not be given the details of your procedure findings, then the procedure report has been included in a sealed envelope for you to review at your convenience later.  YOU SHOULD EXPECT: Some feelings of bloating in the abdomen. Passage of more gas than usual.  Walking can help get rid of the air that was put into your GI tract during the procedure and reduce the bloating. If you had a lower endoscopy (such as a colonoscopy or flexible sigmoidoscopy) you may notice spotting of blood in your stool or on the toilet paper. If you underwent a bowel prep for your procedure, you may not have a normal bowel movement for a few days.  Please Note:  You might notice some irritation and congestion in your nose or some drainage.  This is from the oxygen used during your procedure.  There is no need for concern and it should clear up in a day or so.  SYMPTOMS TO REPORT IMMEDIATELY:   Following lower endoscopy (colonoscopy or flexible sigmoidoscopy):  Excessive amounts of blood in the stool  Significant tenderness or worsening of abdominal pains  Swelling of the abdomen that is new, acute  Fever of 100F or higher   Following upper endoscopy (EGD)  Vomiting of blood or coffee ground material  New chest pain or pain under the shoulder blades  Painful or persistently difficult swallowing  New shortness of breath  Fever of 100F or higher  Black, tarry-looking stools  For urgent or emergent issues, a gastroenterologist can be reached at any hour by calling [phone]. Do not use MyChart messaging for urgent  concerns.    DIET:  We do recommend a small meal at first, but then you may proceed to your regular diet.  Drink plenty of fluids but you should avoid alcoholic beverages for 24 hours.  ACTIVITY:  You should plan to take it easy for the rest of today and you should NOT DRIVE or use heavy machinery until tomorrow (because of the sedation medicines used during the test).    FOLLOW UP: Our staff will call the number listed on your records 48-72 hours following your procedure to check on you and address any questions or concerns that you may have regarding the information given to you following your procedure. If we do not reach you, we will leave a message.  We will attempt to reach you two times.  During this call, we will ask if you have developed any symptoms of COVID 19. If you develop any symptoms (ie: fever, flu-like symptoms, shortness of breath, cough etc.) before then, please call [phone].  If you test positive for Covid 19 in the 2 weeks post procedure, please call and report this information to [place].    If any biopsies were taken you will be contacted by phone or by letter within the next 1-3 weeks.  Please call us at [phone] if you have not heard about the biopsies in 3 weeks.    SIGNATURES/CONFIDENTIALITY: You and/or your care partner have signed paperwork which will be entered into your electronic medical record.  These signatures attest  to the fact that that the information above on your After Visit Summary has been reviewed and is understood.  Full responsibility of the confidentiality of this discharge information lies with you and/or your care-partner.

## 2020-03-16 ENCOUNTER — Telehealth: Payer: Self-pay

## 2020-03-16 NOTE — Telephone Encounter (Signed)
  Follow up Call-  Call back number 03/14/2020  Post procedure Call Back phone  # [phone]  Permission to leave phone message Yes  Some recent data might be hidden     Patient questions:  Do you have a fever, pain , or abdominal swelling? No. Pain Score  0 *  Have you tolerated food without any problems? Yes.    Have you been able to return to your normal activities? Yes.    Do you have any questions about your discharge instructions: Diet   No. Medications  No. Follow up visit  No.  Do you have questions or concerns about your Care? No.  Actions: * If pain score is 4 or above: No action needed, pain <4. 1. Have you developed a fever since your procedure? no  2.   Have you had an respiratory symptoms (SOB or cough) since your procedure? no  3.   Have you tested positive for COVID 19 since your procedure no  4.   Have you had any family members/close contacts diagnosed with the COVID 19 since your procedure?  no   If yes to any of these questions please route to Joylene John, RN and Erenest Rasher, RN

## 2020-03-17 DIAGNOSIS — D0471 Carcinoma in situ of skin of right lower limb, including hip: Secondary | ICD-10-CM | POA: Diagnosis not present

## 2020-03-17 DIAGNOSIS — L820 Inflamed seborrheic keratosis: Secondary | ICD-10-CM | POA: Diagnosis not present

## 2020-03-17 DIAGNOSIS — C44712 Basal cell carcinoma of skin of right lower limb, including hip: Secondary | ICD-10-CM | POA: Diagnosis not present

## 2020-03-28 ENCOUNTER — Telehealth: Payer: Self-pay | Admitting: Gastroenterology

## 2020-03-28 DIAGNOSIS — D369 Benign neoplasm, unspecified site: Secondary | ICD-10-CM

## 2020-03-28 NOTE — Telephone Encounter (Signed)
Pt would more information about genetic testing that had been discussed.

## 2020-03-28 NOTE — Telephone Encounter (Signed)
Patient has decided she wants to proceed with genetics.  Referral placed.  She will be contacted by oncology/genetics directly with an appt date and time.

## 2020-04-13 ENCOUNTER — Other Ambulatory Visit: Payer: Self-pay

## 2020-04-14 ENCOUNTER — Encounter: Payer: Self-pay | Admitting: Internal Medicine

## 2020-04-14 ENCOUNTER — Ambulatory Visit: Payer: Medicare PPO | Admitting: Internal Medicine

## 2020-04-14 VITALS — BP 150/90 | HR 69 | Temp 97.6°F | Ht 64.0 in | Wt 246.3 lb

## 2020-04-14 DIAGNOSIS — I10 Essential (primary) hypertension: Secondary | ICD-10-CM

## 2020-04-14 DIAGNOSIS — E1169 Type 2 diabetes mellitus with other specified complication: Secondary | ICD-10-CM

## 2020-04-14 DIAGNOSIS — E1165 Type 2 diabetes mellitus with hyperglycemia: Secondary | ICD-10-CM | POA: Diagnosis not present

## 2020-04-14 DIAGNOSIS — E785 Hyperlipidemia, unspecified: Secondary | ICD-10-CM

## 2020-04-14 LAB — POCT GLYCOSYLATED HEMOGLOBIN (HGB A1C): Hemoglobin A1C: 8 % — AB (ref 4.0–5.6)

## 2020-04-14 MED ORDER — HYDROCHLOROTHIAZIDE 25 MG PO TABS: 25.0000 mg | ORAL_TABLET | Freq: Every day | ORAL | 1 refills | Status: DC

## 2020-04-14 MED ORDER — GLIPIZIDE ER 10 MG PO TB24: 10.0000 mg | ORAL_TABLET | Freq: Every morning | ORAL | 1 refills | Status: DC

## 2020-04-14 NOTE — Patient Instructions (Signed)
-  Nice seeing you today!!  -Increase glipizide (glucotrol) to 10 mg daily.  -Start HCTZ 25 mg daily.  -Schedule follow up in 3 months.

## 2020-04-14 NOTE — Progress Notes (Signed)
Established Patient Office Visit     This visit occurred during the SARS-CoV-2 public health emergency.  Safety protocols were in place, including screening questions prior to the visit, additional usage of staff PPE, and extensive cleaning of exam room while observing appropriate contact time as indicated for disinfecting solutions.    CC/Reason for Visit: Follow-up chronic conditions  HPI: Diane Decker is a 74 y.o. female who is coming in today for the above mentioned reasons. Past Medical History is significant for: Hypertension and has been well controlled, type 2 diabetes that has not been well controlled with recent A1c of 9.7 in March, depression that has been stable.  She also has a history of GERD and hyperlipidemia.  At last visit her Metformin was increased to 1000 mg twice daily in response to her elevated A1c she is here today for follow-up of this.  She would also like me to look at several areas that the dermatologist has treated.  She wants to know what to do with a topical solution that the dermatologist had prescribed.   Past Medical/Surgical History: Past Medical History:  Diagnosis Date  . Allergy   . Arthritis   . Barrett's esophagus   . Cancer ([place])    skin cancer  . Chicken pox   . Depression   . Diabetes mellitus without complication ([place])   . Family history of polyps in the colon   . GERD (gastroesophageal reflux disease)   . Hypertension   . Osteopenia   . Sleep apnea    borderline- no cpap use     Past Surgical History:  Procedure Laterality Date  . ABDOMINAL HYSTERECTOMY    . BREAST BIOPSY  2007  . COLONOSCOPY    . KNEE ARTHROSCOPY Left    ~15 yrs ago   . POLYPECTOMY    . TONSILLECTOMY  1949  . UPPER GASTROINTESTINAL ENDOSCOPY      Social History:  reports that she has quit smoking. She has never used smokeless tobacco. She reports that she does not drink alcohol and does not use drugs.  Allergies: Allergies  Allergen  Reactions  . Penicillins Anaphylaxis  . Whey Protein [Protein] Shortness Of Breath  . Lisinopril Other (See Comments) and Swelling  . Losartan Potassium Swelling  . Losartan Nausea And Vomiting    Family History:  Family History  Problem Relation Age of Onset  . Breast cancer Mother   . Lung cancer Father   . Liver cancer Father   . Arthritis Sister   . Depression Sister   . Hyperlipidemia Sister   . Hypertension Sister   . Alcohol abuse Brother   . Arthritis Brother   . Depression Brother   . Hyperlipidemia Brother   . Hypertension Brother   . Cancer Daughter   . COPD Daughter   . Depression Son   . Arthritis Maternal Grandmother   . Depression Maternal Grandmother   . Hyperlipidemia Maternal Grandmother   . Hypertension Maternal Grandmother   . Stroke Maternal Grandmother   . Alcohol abuse Maternal Grandfather   . Arthritis Maternal Grandfather   . Cancer Maternal Grandfather   . Hyperlipidemia Maternal Grandfather   . Hypertension Maternal Grandfather   . Stroke Maternal Grandfather   . Arthritis Paternal Grandmother   . Hyperlipidemia Paternal Grandmother   . Hypertension Paternal Grandmother   . Arthritis Paternal Grandfather   . Alcohol abuse Paternal Grandfather   . Diabetes Paternal Grandfather   . Hyperlipidemia Paternal Grandfather   .  Heart disease Paternal Grandfather   . Hearing loss Paternal Grandfather   . Stroke Paternal Grandfather   . Colon polyps Neg Hx   . Esophageal cancer Neg Hx   . Rectal cancer Neg Hx   . Stomach cancer Neg Hx      Current Outpatient Medications:  .  aspirin 81 MG chewable tablet, Chew by mouth daily., Disp: , Rfl:  .  Aspirin Buf,CaCarb-MgCarb-MgO, 81 MG TABS, Take by mouth., Disp: , Rfl:  .  atenolol (TENORMIN) 50 MG tablet, Take 50 mg by mouth daily., Disp: , Rfl:  .  atorvastatin (LIPITOR) 20 MG tablet, Take 20 mg by mouth daily., Disp: , Rfl:  .  b complex vitamins tablet, Take by mouth., Disp: , Rfl:  .   buPROPion (WELLBUTRIN SR) 150 MG 12 hr tablet, Take 150 mg by mouth 2 (two) times daily., Disp: , Rfl:  .  Calcium Carbonate-Vitamin D (CALCIUM-D PO), Take by mouth. Caltrate plus D, Disp: , Rfl:  .  Cyanocobalamin (VITAMIN B 12 PO), Take by mouth., Disp: , Rfl:  .  erythromycin ophthalmic ointment, Place one application into both eyes every 6 (six) hours for 5 days., Disp: , Rfl:  .  esomeprazole (NEXIUM) 20 MG capsule, Take 20 mg by mouth daily at 12 noon., Disp: , Rfl:  .  fluorouracil (EFUDEX) 5 % cream, , Disp: , Rfl:  .  glipiZIDE (GLUCOTROL XL) 10 MG 24 hr tablet, Take 1 tablet (10 mg total) by mouth every morning., Disp: 90 tablet, Rfl: 1 .  metFORMIN (GLUCOPHAGE) 500 MG tablet, Take 2 tablets (1,000 mg total) by mouth 2 (two) times daily with a meal., Disp: 120 tablet, Rfl: 2 .  Multiple Vitamin (MULTIVITAMIN) capsule, Take by mouth., Disp: , Rfl:  .  traZODone (DESYREL) 150 MG tablet, TAKE 1/2 TABLET IN THE EVENING, Disp: , Rfl:  .  vitamin E 1000 UNIT capsule, Take 1,000 Units by mouth daily., Disp: , Rfl:  .  XIIDRA 5 % SOLN, , Disp: , Rfl:  .  hydrochlorothiazide (HYDRODIURIL) 25 MG tablet, Take 1 tablet (25 mg total) by mouth daily., Disp: 90 tablet, Rfl: 1  Review of Systems:  Constitutional: Denies fever, chills, diaphoresis, appetite change and fatigue.  HEENT: Denies photophobia, eye pain, redness, hearing loss, ear pain, congestion, sore throat, rhinorrhea, sneezing, mouth sores, trouble swallowing, neck pain, neck stiffness and tinnitus.   Respiratory: Denies SOB, DOE, cough, chest tightness,  and wheezing.   Cardiovascular: Denies chest pain, palpitations and leg swelling.  Gastrointestinal: Denies nausea, vomiting, abdominal pain, diarrhea, constipation, blood in stool and abdominal distention.  Genitourinary: Denies dysuria, urgency, frequency, hematuria, flank pain and difficulty urinating.  Endocrine: Denies: hot or cold intolerance, sweats, changes in hair or nails,  polyuria, polydipsia. Musculoskeletal: Denies myalgias, back pain, joint swelling, arthralgias and gait problem.  Skin: Denies pallor. Neurological: Denies dizziness, seizures, syncope, weakness, light-headedness, numbness and headaches.  Hematological: Denies adenopathy. Easy bruising, personal or family bleeding history  Psychiatric/Behavioral: Denies suicidal ideation, mood changes, confusion, nervousness, sleep disturbance and agitation    Physical Exam: Vitals:   04/14/20 1421  BP: (!) 150/90  Pulse: 69  Temp: 97.6 F (36.4 C)  TempSrc: Temporal  SpO2: 94%  Weight: 246 lb 4.8 oz (111.7 kg)  Height: 5\' 4"  (1.626 m)    Body mass index is 42.28 kg/m.   Constitutional: NAD, calm, comfortable Eyes: PERRL, lids and conjunctivae normal ENMT: Mucous membranes are moist. Respiratory: clear to auscultation bilaterally, no wheezing,  no crackles. Normal respiratory effort. No accessory muscle use.  Cardiovascular: Regular rate and rhythm, no murmurs / rubs / gallops. No extremity edema.   Neurologic: Grossly intact and nonfocal Psychiatric: Normal judgment and insight. Alert and oriented x 3. Normal mood.    Impression and Plan:  Uncontrolled type 2 diabetes mellitus with hyperglycemia (HCC)  -A1c is 8.0 today down from 9.7 3 months ago. -Continue maximum dose Metformin, increase glipizide from 5 to 10 mg daily. -She has been congratulated on her 4 pound weight loss.  Morbid obesity ([place]) -Discussed healthy lifestyle, including increased physical activity and better food choices to promote weight loss.  Essential hypertension -Not well controlled. -She is on atenolol 50 mg daily, add hydrochlorothiazide 25 mg daily.  She will return in 3 months for follow-up.  Hyperlipidemia associated with type 2 diabetes mellitus ([place]) -She will need to have lipids checked when she returns for physical, she is on atorvastatin 20 mg daily.   Patient Instructions  -Nice seeing you  today!!  -Increase glipizide (glucotrol) to 10 mg daily.  -Start HCTZ 25 mg daily.  -Schedule follow up in 3 months.     Lelon Frohlich, MD [facility] at [facility]

## 2020-06-30 ENCOUNTER — Other Ambulatory Visit: Payer: Self-pay | Admitting: Internal Medicine

## 2020-06-30 DIAGNOSIS — E1165 Type 2 diabetes mellitus with hyperglycemia: Secondary | ICD-10-CM

## 2020-07-14 ENCOUNTER — Ambulatory Visit: Payer: Medicare PPO | Admitting: Internal Medicine

## 2020-07-14 ENCOUNTER — Other Ambulatory Visit: Payer: Self-pay

## 2020-07-14 VITALS — BP 120/80 | HR 70 | Temp 98.2°F | Wt 242.5 lb

## 2020-07-14 DIAGNOSIS — E1165 Type 2 diabetes mellitus with hyperglycemia: Secondary | ICD-10-CM

## 2020-07-14 DIAGNOSIS — E785 Hyperlipidemia, unspecified: Secondary | ICD-10-CM

## 2020-07-14 DIAGNOSIS — E1169 Type 2 diabetes mellitus with other specified complication: Secondary | ICD-10-CM | POA: Diagnosis not present

## 2020-07-14 DIAGNOSIS — I10 Essential (primary) hypertension: Secondary | ICD-10-CM

## 2020-07-14 LAB — POCT GLYCOSYLATED HEMOGLOBIN (HGB A1C): Hemoglobin A1C: 8.2 % — AB (ref 4.0–5.6)

## 2020-07-14 MED ORDER — HYDROCHLOROTHIAZIDE 25 MG PO TABS: 25.0000 mg | ORAL_TABLET | Freq: Every day | ORAL | 1 refills | Status: DC

## 2020-07-14 MED ORDER — METFORMIN HCL 500 MG PO TABS: 1000.0000 mg | ORAL_TABLET | Freq: Two times a day (BID) | ORAL | 1 refills | Status: DC

## 2020-07-14 MED ORDER — OZEMPIC (0.25 OR 0.5 MG/DOSE) 2 MG/1.5ML ~~LOC~~ SOPN: 0.5000 mg | PEN_INJECTOR | SUBCUTANEOUS | 3 refills | Status: DC

## 2020-07-14 NOTE — Patient Instructions (Signed)
-  Nice seeing you today!!  -Start ozempic 0.25 mg once a week for 4 weeks and then increase to 0.5 mg.  -Schedule follow up in 3 months.

## 2020-07-14 NOTE — Progress Notes (Signed)
Established Patient Office Visit     This visit occurred during the SARS-CoV-2 public health emergency.  Safety protocols were in place, including screening questions prior to the visit, additional usage of staff PPE, and extensive cleaning of exam room while observing appropriate contact time as indicated for disinfecting solutions.    CC/Reason for Visit: Follow-up chronic medical conditions  HPI: Jonella Redditt is a 74 y.o. female who is coming in today for the above mentioned reasons. Past Medical History is significant for: Hypertension, type 2 diabetes, hyperlipidemia and GERD.  At last visit her glipizide had been increased from 5 to 10 mg in addition to maximal dose Metformin with an A1c of 8.0.  She has lost an extra 4 pounds since last visit.  She is doing well.  She tells me that she is visiting her wife for a month with her sister next week.  She declines flu vaccination.   Past Medical/Surgical History: Past Medical History:  Diagnosis Date  . Allergy   . Arthritis   . Barrett's esophagus   . Cancer ([place])    skin cancer  . Chicken pox   . Depression   . Diabetes mellitus without complication ([place])   . Family history of polyps in the colon   . GERD (gastroesophageal reflux disease)   . Hypertension   . Osteopenia   . Sleep apnea    borderline- no cpap use     Past Surgical History:  Procedure Laterality Date  . ABDOMINAL HYSTERECTOMY    . BREAST BIOPSY  2007  . COLONOSCOPY    . KNEE ARTHROSCOPY Left    ~15 yrs ago   . POLYPECTOMY    . TONSILLECTOMY  1949  . UPPER GASTROINTESTINAL ENDOSCOPY      Social History:  reports that she has quit smoking. She has never used smokeless tobacco. She reports that she does not drink alcohol and does not use drugs.  Allergies: Allergies  Allergen Reactions  . Penicillins Anaphylaxis  . Whey Protein [Protein] Shortness Of Breath  . Lisinopril Other (See Comments) and Swelling  . Losartan Potassium  Swelling  . Losartan Nausea And Vomiting    Family History:  Family History  Problem Relation Age of Onset  . Breast cancer Mother   . Lung cancer Father   . Liver cancer Father   . Arthritis Sister   . Depression Sister   . Hyperlipidemia Sister   . Hypertension Sister   . Alcohol abuse Brother   . Arthritis Brother   . Depression Brother   . Hyperlipidemia Brother   . Hypertension Brother   . Cancer Daughter   . COPD Daughter   . Depression Son   . Arthritis Maternal Grandmother   . Depression Maternal Grandmother   . Hyperlipidemia Maternal Grandmother   . Hypertension Maternal Grandmother   . Stroke Maternal Grandmother   . Alcohol abuse Maternal Grandfather   . Arthritis Maternal Grandfather   . Cancer Maternal Grandfather   . Hyperlipidemia Maternal Grandfather   . Hypertension Maternal Grandfather   . Stroke Maternal Grandfather   . Arthritis Paternal Grandmother   . Hyperlipidemia Paternal Grandmother   . Hypertension Paternal Grandmother   . Arthritis Paternal Grandfather   . Alcohol abuse Paternal Grandfather   . Diabetes Paternal Grandfather   . Hyperlipidemia Paternal Grandfather   . Heart disease Paternal Grandfather   . Hearing loss Paternal Grandfather   . Stroke Paternal Grandfather   . Colon polyps  Neg Hx   . Esophageal cancer Neg Hx   . Rectal cancer Neg Hx   . Stomach cancer Neg Hx      Current Outpatient Medications:  .  aspirin 81 MG chewable tablet, Chew by mouth daily., Disp: , Rfl:  .  Aspirin Buf,CaCarb-MgCarb-MgO, 81 MG TABS, Take by mouth., Disp: , Rfl:  .  atenolol (TENORMIN) 50 MG tablet, Take 50 mg by mouth daily., Disp: , Rfl:  .  atorvastatin (LIPITOR) 20 MG tablet, Take 20 mg by mouth daily., Disp: , Rfl:  .  b complex vitamins tablet, Take by mouth., Disp: , Rfl:  .  Calcium Carbonate-Vitamin D (CALCIUM-D PO), Take by mouth. Caltrate plus D, Disp: , Rfl:  .  Cyanocobalamin (VITAMIN B 12 PO), Take by mouth., Disp: , Rfl:  .   erythromycin ophthalmic ointment, Place one application into both eyes every 6 (six) hours for 5 days., Disp: , Rfl:  .  esomeprazole (NEXIUM) 20 MG capsule, Take 20 mg by mouth daily at 12 noon., Disp: , Rfl:  .  fluorouracil (EFUDEX) 5 % cream, , Disp: , Rfl:  .  glipiZIDE (GLUCOTROL XL) 10 MG 24 hr tablet, Take 1 tablet (10 mg total) by mouth every morning., Disp: 90 tablet, Rfl: 1 .  hydrochlorothiazide (HYDRODIURIL) 25 MG tablet, Take 1 tablet (25 mg total) by mouth daily., Disp: 90 tablet, Rfl: 1 .  metFORMIN (GLUCOPHAGE) 500 MG tablet, Take 2 tablets (1,000 mg total) by mouth 2 (two) times daily with a meal., Disp: 360 tablet, Rfl: 1 .  Multiple Vitamin (MULTIVITAMIN) capsule, Take by mouth., Disp: , Rfl:  .  traZODone (DESYREL) 150 MG tablet, TAKE 1/2 TABLET IN THE EVENING, Disp: , Rfl:  .  vitamin E 1000 UNIT capsule, Take 1,000 Units by mouth daily., Disp: , Rfl:  .  XIIDRA 5 % SOLN, , Disp: , Rfl:  .  buPROPion (WELLBUTRIN SR) 150 MG 12 hr tablet, Take 150 mg by mouth 2 (two) times daily. (Patient not taking: Reported on 07/14/2020), Disp: , Rfl:  .  Semaglutide,0.25 or 0.5MG /DOS, (OZEMPIC, 0.25 OR 0.5 MG/DOSE,) 2 MG/1.5ML SOPN, Inject 0.5 mg into the skin once a week., Disp: 1.5 mL, Rfl: 3  Review of Systems:  Constitutional: Denies fever, chills, diaphoresis, appetite change and fatigue.  HEENT: Denies photophobia, eye pain, redness, hearing loss, ear pain, congestion, sore throat, rhinorrhea, sneezing, mouth sores, trouble swallowing, neck pain, neck stiffness and tinnitus.   Respiratory: Denies SOB, DOE, cough, chest tightness,  and wheezing.   Cardiovascular: Denies chest pain, palpitations and leg swelling.  Gastrointestinal: Denies nausea, vomiting, abdominal pain, diarrhea, constipation, blood in stool and abdominal distention.  Genitourinary: Denies dysuria, urgency, frequency, hematuria, flank pain and difficulty urinating.  Endocrine: Denies: hot or cold intolerance, sweats,  changes in hair or nails, polyuria, polydipsia. Musculoskeletal: Denies myalgias, back pain, joint swelling, arthralgias and gait problem.  Skin: Denies pallor, rash and wound.  Neurological: Denies dizziness, seizures, syncope, weakness, light-headedness, numbness and headaches.  Hematological: Denies adenopathy. Easy bruising, personal or family bleeding history  Psychiatric/Behavioral: Denies suicidal ideation, mood changes, confusion, nervousness, sleep disturbance and agitation    Physical Exam: Vitals:   07/14/20 1403  BP: 120/80  Pulse: 70  Temp: 98.2 F (36.8 C)  TempSrc: Oral  SpO2: 94%  Weight: 242 lb 8 oz (110 kg)    Body mass index is 41.63 kg/m.   Constitutional: NAD, calm, comfortable, obese Eyes: PERRL, lids and conjunctivae normal, wears corrective lenses  ENMT: Mucous membranes are moist. Respiratory: clear to auscultation bilaterally, no wheezing, no crackles. Normal respiratory effort. No accessory muscle use.  Cardiovascular: Regular rate and rhythm, no murmurs / rubs / gallops. No extremity edema.  Neurologic: Grossly intact and nonfocal Psychiatric: Normal judgment and insight. Alert and oriented x 3. Normal mood.    Impression and Plan:  Uncontrolled type 2 diabetes mellitus with hyperglycemia (HCC)  -A1c remains uncontrolled at 8.2. -Continue maximum dose Metformin and glipizide. -Add Ozempic 0.25 mg weekly. -We have discussed diet in detail. -Follow-up in 3 months.  Essential hypertension  -Blood pressures well controlled on current regimen of hydrochlorothiazide 25 mg daily.  Hyperlipidemia associated with type 2 diabetes mellitus ([place]) -She is on Lipitor 20 mg, do not have any recent lipids on file, she will return for physical to have these drawn fasting.  Morbid obesity ([place]) -Discussed healthy lifestyle, including increased physical activity and better food choices to promote weight loss.     Patient Instructions  -Nice seeing you  today!!  -Start ozempic 0.25 mg once a week for 4 weeks and then increase to 0.5 mg.  -Schedule follow up in 3 months.     Lelon Frohlich, MD [facility] at [facility]

## 2020-08-15 ENCOUNTER — Telehealth: Payer: Self-pay | Admitting: *Deleted

## 2020-08-15 DIAGNOSIS — E1165 Type 2 diabetes mellitus with hyperglycemia: Secondary | ICD-10-CM

## 2020-08-15 NOTE — Telephone Encounter (Signed)
Patient called stating Dr Jerilee Hoh gave her samples of ozempic patient states she is down to one shot left. Patient wants to know can this be a prescription or can she get it over the counter. [phone]

## 2020-08-16 NOTE — Telephone Encounter (Signed)
Rx sent on 9/23.

## 2020-08-22 NOTE — Telephone Encounter (Signed)
Patient called in stating that she has been having a lot of side effects from taking the medication Ozempic. She has developed a vision issue, stomach issues (cramping and everything she eats runs through her) she's lost 13 pounds in the last 6 weeks. She wants to know is this normal for taking this medication. She wants the Dr to call her back because she wants to know if she should cont to take this medication or does the Dr want to try something different     Please call and advise

## 2020-08-23 MED ORDER — OZEMPIC (0.25 OR 0.5 MG/DOSE) 2 MG/1.5ML ~~LOC~~ SOPN: 0.5000 mg | PEN_INJECTOR | SUBCUTANEOUS | 3 refills | Status: DC

## 2020-08-23 NOTE — Telephone Encounter (Signed)
Ozempic can cause some nausea, but should be tolerable after first few weeks. Weight loss is typically seen with ozempic. Not sure what these vision issues are, but feel she should be seen by an ophthalmologist. If nausea is too severe, may need to change medication. If she would like to discuss in person, please schedule an OV.

## 2020-08-23 NOTE — Addendum Note (Signed)
Addended by: Westley Hummer B on: 08/23/2020 02:03 PM   Modules accepted: Orders

## 2020-08-23 NOTE — Telephone Encounter (Signed)
Patient is aware and a Rx sent

## 2020-09-01 ENCOUNTER — Telehealth: Payer: Self-pay | Admitting: Internal Medicine

## 2020-09-01 DIAGNOSIS — E1165 Type 2 diabetes mellitus with hyperglycemia: Secondary | ICD-10-CM

## 2020-09-01 NOTE — Telephone Encounter (Signed)
Patient would like a referral to an Endocrinologist

## 2020-09-01 NOTE — Telephone Encounter (Signed)
The patient called wanting to talk to Dr. Jerilee Hoh. I told her that Diane Decker can schedule an appointment but Diane Decker doesn't have anything till the end of the month. The patient said that Diane Decker will be dead by then.   This patient has been having difficulty with taking Semaglutide,0.25 or 0.5MG /DOS, (OZEMPIC, 0.25 OR 0.5 MG/DOSE,) 2 MG/1.5ML SOPN  Diane Decker said that Diane Decker is taking too much or too many diabetic medications and it's causing her morning sickness and Diane Decker's tired of being sick for the past 8 weeks.   Diane Decker would like a call back today  Please advise

## 2020-09-01 NOTE — Telephone Encounter (Signed)
Patient is aware and referral placed 

## 2020-09-01 NOTE — Addendum Note (Signed)
Addended by: Westley Hummer B on: 09/01/2020 01:52 PM   Modules accepted: Orders

## 2020-09-01 NOTE — Telephone Encounter (Signed)
I am ok with endo referral. Please let her know that Ozempic can cause some stomach upset initially that typically gets better over ensuing weeks. It also tends to suppress appetite and can cause some weight loss which is beneficial to a lot of pts with DM 2.

## 2020-09-02 NOTE — Telephone Encounter (Signed)
Ozempic should not cause dizziness. Please schedule OV.

## 2020-09-02 NOTE — Telephone Encounter (Signed)
Pt called and was scheduled a virtual appointment on 09/09/2020 at 4:00 she stated that she is having trouble walking across the floor.

## 2020-09-02 NOTE — Telephone Encounter (Signed)
Patient called in again about the medication and what it is doing to her body and how she is feeling. Patient states she is to take another injection on Monday and she is afraid too because of the dizziness and its hard for her to walk. She says nothing is going away with the symptoms she needs some relief because this is too much. She thinks she is taking too much diabetic medication    Please call and advise

## 2020-09-06 NOTE — Telephone Encounter (Signed)
This needs to be switched to in-person please

## 2020-09-06 NOTE — Telephone Encounter (Signed)
Left message on machine for patient to return our call to change her appointment to in office

## 2020-09-08 ENCOUNTER — Encounter: Payer: Self-pay | Admitting: Internal Medicine

## 2020-09-09 ENCOUNTER — Ambulatory Visit: Payer: Medicare PPO | Admitting: Internal Medicine

## 2020-09-09 ENCOUNTER — Encounter: Payer: Self-pay | Admitting: Internal Medicine

## 2020-09-09 ENCOUNTER — Other Ambulatory Visit: Payer: Self-pay

## 2020-09-09 VITALS — BP 102/70 | HR 80 | Temp 98.5°F | Wt 228.5 lb

## 2020-09-09 DIAGNOSIS — E1165 Type 2 diabetes mellitus with hyperglycemia: Secondary | ICD-10-CM

## 2020-09-09 LAB — POCT GLYCOSYLATED HEMOGLOBIN (HGB A1C): Hemoglobin A1C: 6.6 % — AB (ref 4.0–5.6)

## 2020-09-09 LAB — POCT GLUCOSE (DEVICE FOR HOME USE): POC Glucose: 166 mg/dl — AB (ref 70–99)

## 2020-09-09 NOTE — Progress Notes (Signed)
Established Patient Office Visit     This visit occurred during the SARS-CoV-2 public health emergency.  Safety protocols were in place, including screening questions prior to the visit, additional usage of staff PPE, and extensive cleaning of exam room while observing appropriate contact time as indicated for disinfecting solutions.    CC/Reason for Visit: Dizziness, upset stomach, diarrhea  HPI: Diane Decker is a 74 y.o. female who is coming in today for the above mentioned reasons.  She has a history of uncontrolled type 2 diabetes.  When last seen in September her A1c was 8.2 on maximal dose of glipizide and Metformin.  We decided to place her on Ozempic 0.25 mg that she has been taking since the end of September.  She states this medication has caused terrible stomach upset, nausea, diarrhea.  She states she has been dizzy at times.  Throughout this ordeal she has never checked her blood sugar.  She weighed 243 pounds in September and now weighs 228 pounds.  She has continued to take her Ozempic.   Past Medical/Surgical History: Past Medical History:  Diagnosis Date  . Allergy   . Arthritis   . Barrett's esophagus   . Cancer ([place])    skin cancer  . Chicken pox   . Depression   . Diabetes mellitus without complication ([place])   . Family history of polyps in the colon   . GERD (gastroesophageal reflux disease)   . Hypertension   . Osteopenia   . Sleep apnea    borderline- no cpap use     Past Surgical History:  Procedure Laterality Date  . ABDOMINAL HYSTERECTOMY    . BREAST BIOPSY  2007  . COLONOSCOPY    . KNEE ARTHROSCOPY Left    ~15 yrs ago   . POLYPECTOMY    . TONSILLECTOMY  1949  . UPPER GASTROINTESTINAL ENDOSCOPY      Social History:  reports that she has quit smoking. She has never used smokeless tobacco. She reports that she does not drink alcohol and does not use drugs.  Allergies: Allergies  Allergen Reactions  . Penicillins  Anaphylaxis  . Whey Protein [Protein] Shortness Of Breath  . Lisinopril Other (See Comments) and Swelling  . Losartan Potassium Swelling  . Losartan Nausea And Vomiting    Family History:  Family History  Problem Relation Age of Onset  . Breast cancer Mother   . Lung cancer Father   . Liver cancer Father   . Arthritis Sister   . Depression Sister   . Hyperlipidemia Sister   . Hypertension Sister   . Alcohol abuse Brother   . Arthritis Brother   . Depression Brother   . Hyperlipidemia Brother   . Hypertension Brother   . Cancer Daughter   . COPD Daughter   . Depression Son   . Arthritis Maternal Grandmother   . Depression Maternal Grandmother   . Hyperlipidemia Maternal Grandmother   . Hypertension Maternal Grandmother   . Stroke Maternal Grandmother   . Alcohol abuse Maternal Grandfather   . Arthritis Maternal Grandfather   . Cancer Maternal Grandfather   . Hyperlipidemia Maternal Grandfather   . Hypertension Maternal Grandfather   . Stroke Maternal Grandfather   . Arthritis Paternal Grandmother   . Hyperlipidemia Paternal Grandmother   . Hypertension Paternal Grandmother   . Arthritis Paternal Grandfather   . Alcohol abuse Paternal Grandfather   . Diabetes Paternal Grandfather   . Hyperlipidemia Paternal Grandfather   .  Heart disease Paternal Grandfather   . Hearing loss Paternal Grandfather   . Stroke Paternal Grandfather   . Colon polyps Neg Hx   . Esophageal cancer Neg Hx   . Rectal cancer Neg Hx   . Stomach cancer Neg Hx      Current Outpatient Medications:  .  aspirin 81 MG chewable tablet, Chew by mouth daily., Disp: , Rfl:  .  Aspirin Buf,CaCarb-MgCarb-MgO, 81 MG TABS, Take by mouth., Disp: , Rfl:  .  atenolol (TENORMIN) 50 MG tablet, Take 50 mg by mouth daily., Disp: , Rfl:  .  atorvastatin (LIPITOR) 20 MG tablet, Take 20 mg by mouth daily., Disp: , Rfl:  .  b complex vitamins tablet, Take by mouth., Disp: , Rfl:  .  buPROPion (WELLBUTRIN SR) 150 MG  12 hr tablet, Take 150 mg by mouth 2 (two) times daily. , Disp: , Rfl:  .  Calcium Carbonate-Vitamin D (CALCIUM-D PO), Take by mouth. Caltrate plus D, Disp: , Rfl:  .  Cyanocobalamin (VITAMIN B 12 PO), Take by mouth., Disp: , Rfl:  .  erythromycin ophthalmic ointment, Place one application into both eyes every 6 (six) hours for 5 days., Disp: , Rfl:  .  esomeprazole (NEXIUM) 20 MG capsule, Take 20 mg by mouth daily at 12 noon., Disp: , Rfl:  .  fluorouracil (EFUDEX) 5 % cream, , Disp: , Rfl:  .  glipiZIDE (GLUCOTROL XL) 10 MG 24 hr tablet, Take 1 tablet (10 mg total) by mouth every morning., Disp: 90 tablet, Rfl: 1 .  hydrochlorothiazide (HYDRODIURIL) 25 MG tablet, Take 1 tablet (25 mg total) by mouth daily., Disp: 90 tablet, Rfl: 1 .  Multiple Vitamin (MULTIVITAMIN) capsule, Take by mouth., Disp: , Rfl:  .  traZODone (DESYREL) 150 MG tablet, TAKE 1/2 TABLET IN THE EVENING, Disp: , Rfl:  .  vitamin E 1000 UNIT capsule, Take 1,000 Units by mouth daily., Disp: , Rfl:  .  XIIDRA 5 % SOLN, , Disp: , Rfl:  .  metFORMIN (GLUCOPHAGE) 500 MG tablet, Take 2 tablets (1,000 mg total) by mouth 2 (two) times daily with a meal., Disp: 360 tablet, Rfl: 1  Review of Systems:  Constitutional: Denies fever, chills, diaphoresis, appetite change. HEENT: Denies photophobia, eye pain, redness, hearing loss, ear pain, congestion, sore throat, rhinorrhea, sneezing, mouth sores, trouble swallowing, neck pain, neck stiffness and tinnitus.   Respiratory: Denies SOB, DOE, cough, chest tightness,  and wheezing.   Cardiovascular: Denies chest pain, palpitations and leg swelling.  Gastrointestinal: Denies constipation, blood in stool and abdominal distention.  Genitourinary: Denies dysuria, urgency, frequency, hematuria, flank pain and difficulty urinating.  Endocrine: Denies: hot or cold intolerance, sweats, changes in hair or nails, polyuria, polydipsia. Musculoskeletal: Denies myalgias, back pain, joint swelling,  arthralgias and gait problem.  Skin: Denies pallor, rash and wound.  Neurological: Denies  seizures, syncope, weakness,numbness and headaches.  Hematological: Denies adenopathy. Easy bruising, personal or family bleeding history  Psychiatric/Behavioral: Denies suicidal ideation, mood changes, confusion, nervousness, sleep disturbance and agitation    Physical Exam: Vitals:   09/09/20 1557  BP: 102/70  Pulse: 80  Temp: 98.5 F (36.9 C)  TempSrc: Oral  SpO2: 95%  Weight: 228 lb 8 oz (103.6 kg)    Body mass index is 39.22 kg/m.   Constitutional: NAD, calm, comfortable Eyes: PERRL, lids and conjunctivae normal, wears corrective lenses ENMT: Mucous membranes are moist.  Respiratory: clear to auscultation bilaterally, no wheezing, no crackles. Normal respiratory effort. No accessory muscle  use.  Cardiovascular: Regular rate and rhythm, no murmurs / rubs / gallops. No extremity edema. Neurologic: Grossly intact and nonfocal Psychiatric: Normal judgment and insight. Alert and oriented x 3. Normal mood.    Impression and Plan:  Uncontrolled type 2 diabetes mellitus with hyperglycemia ([place])  -With decrease in A1c from 8.2-6.6, I wonder she has been having some hypoglycemic episodes. -She no longer wants to be on Ozempic, I am okay with discontinuing. -She will follow-up in 3 months. -She will start taking her CBGs routinely and when symptomatic.    Lelon Frohlich, MD [facility]

## 2020-09-14 ENCOUNTER — Telehealth: Payer: Self-pay | Admitting: Internal Medicine

## 2020-09-14 MED ORDER — ACCU-CHEK GUIDE ME W/DEVICE KIT: PACK | 0 refills | Status: DC

## 2020-09-14 NOTE — Telephone Encounter (Signed)
Patient is calling back and stated that the pharmacy hasn't received a prescription for her diabetic machine yet, please advise. [id number] [address], [place]  Phone:  [phone] Fax:  [phone]  CB is [phone]

## 2020-09-14 NOTE — Telephone Encounter (Signed)
Rx sent 

## 2020-09-29 DIAGNOSIS — H5203 Hypermetropia, bilateral: Secondary | ICD-10-CM | POA: Diagnosis not present

## 2020-09-29 DIAGNOSIS — E119 Type 2 diabetes mellitus without complications: Secondary | ICD-10-CM | POA: Diagnosis not present

## 2020-09-29 DIAGNOSIS — H2513 Age-related nuclear cataract, bilateral: Secondary | ICD-10-CM | POA: Diagnosis not present

## 2020-09-29 DIAGNOSIS — H52203 Unspecified astigmatism, bilateral: Secondary | ICD-10-CM | POA: Diagnosis not present

## 2020-09-29 LAB — HM DIABETES EYE EXAM

## 2020-10-03 ENCOUNTER — Encounter: Payer: Self-pay | Admitting: Internal Medicine

## 2020-10-06 ENCOUNTER — Telehealth: Payer: Self-pay | Admitting: Internal Medicine

## 2020-10-06 MED ORDER — ACCU-CHEK GUIDE VI STRP: ORAL_STRIP | 12 refills | Status: DC

## 2020-10-06 NOTE — Telephone Encounter (Signed)
Pt call and stated she need the accu check test strips sent in to  [id number] [place], [place] Phone:  [phone]  Fax:  [phone]

## 2020-10-06 NOTE — Telephone Encounter (Signed)
Refill sent.

## 2020-10-25 ENCOUNTER — Ambulatory Visit: Payer: Medicare PPO | Admitting: Internal Medicine

## 2020-10-26 ENCOUNTER — Ambulatory Visit: Payer: Medicare PPO | Admitting: Internal Medicine

## 2020-10-26 ENCOUNTER — Other Ambulatory Visit: Payer: Self-pay | Admitting: Internal Medicine

## 2020-10-26 DIAGNOSIS — E1165 Type 2 diabetes mellitus with hyperglycemia: Secondary | ICD-10-CM

## 2020-10-26 DIAGNOSIS — I10 Essential (primary) hypertension: Secondary | ICD-10-CM

## 2020-10-26 MED ORDER — GLIPIZIDE ER 10 MG PO TB24: 10.0000 mg | ORAL_TABLET | Freq: Every morning | ORAL | 1 refills | Status: DC

## 2020-10-26 MED ORDER — TRAZODONE HCL 150 MG PO TABS: ORAL_TABLET | ORAL | 1 refills | Status: DC

## 2020-10-26 MED ORDER — ACCU-CHEK SOFTCLIX LANCETS MISC
0 refills | Status: DC
Start: 2020-10-26 — End: 2023-05-01

## 2020-10-26 MED ORDER — HYDROCHLOROTHIAZIDE 25 MG PO TABS: 25.0000 mg | ORAL_TABLET | Freq: Every day | ORAL | 1 refills | Status: DC

## 2020-10-26 MED ORDER — ATENOLOL 50 MG PO TABS
50.0000 mg | ORAL_TABLET | Freq: Every day | ORAL | 1 refills | Status: DC
Start: 2020-10-26 — End: 2021-02-08

## 2020-10-26 MED ORDER — ATORVASTATIN CALCIUM 20 MG PO TABS: 20.0000 mg | ORAL_TABLET | Freq: Every day | ORAL | 1 refills | Status: DC

## 2020-10-26 NOTE — Telephone Encounter (Signed)
Patient needs refills--pt states she is out of some of her meds and needs them asap.  She states the pharmacy has called over here 2 times with no response..  Lancets for glucose monitor Atenolol 50 mg tablet Trazadone 150 mg tablet Atorvastatin 20 mg tablet Glipizide ER 10 mg tablet HCTZ 25 mg tablet  [facility] [address]

## 2020-11-08 LAB — HEMOGLOBIN A1C: Hemoglobin A1C: 6.1

## 2020-11-09 ENCOUNTER — Ambulatory Visit: Payer: Medicare PPO | Admitting: Internal Medicine

## 2020-11-10 ENCOUNTER — Encounter: Payer: Self-pay | Admitting: Internal Medicine

## 2020-11-10 ENCOUNTER — Other Ambulatory Visit: Payer: Self-pay

## 2020-11-10 ENCOUNTER — Ambulatory Visit: Payer: Medicare PPO | Admitting: Internal Medicine

## 2020-11-10 VITALS — BP 120/76 | HR 82 | Ht 64.0 in | Wt 227.5 lb

## 2020-11-10 DIAGNOSIS — E119 Type 2 diabetes mellitus without complications: Secondary | ICD-10-CM

## 2020-11-10 LAB — POCT GLUCOSE (DEVICE FOR HOME USE): POC Glucose: 106 mg/dl — AB (ref 70–99)

## 2020-11-10 MED ORDER — OZEMPIC (0.25 OR 0.5 MG/DOSE) 2 MG/1.5ML ~~LOC~~ SOPN: 0.2500 mg | PEN_INJECTOR | SUBCUTANEOUS | 3 refills | Status: DC

## 2020-11-10 MED ORDER — GLIPIZIDE 5 MG PO TABS: 5.0000 mg | ORAL_TABLET | Freq: Every day | ORAL | 3 refills | Status: DC

## 2020-11-10 MED ORDER — ACCU-CHEK GUIDE VI STRP: ORAL_STRIP | 12 refills | Status: DC

## 2020-11-10 NOTE — Progress Notes (Signed)
Name: Diane Decker [facility]  MRN/ DOB: [id number], [birth year]   Age/ Sex: 75 y.o., female    PCP: Isaac Bliss, Rayford Halsted, MD   Reason for Endocrinology Evaluation: Type 2 Diabetes Mellitus     Date of Initial Endocrinology Visit: 11/10/2020     PATIENT IDENTIFIER: Diane Decker is a 75 y.o. female with a past medical history of T2DM, barrett's esophagus. The patient presented for initial endocrinology clinic visit on 11/10/2020 for consultative assistance with her diabetes management.    HPI: Diane Decker was    Diagnosed with DM  At age 29 Prior Medications tried/Intolerance: Ozempic- GI side effects  Currently checking blood sugars 1x / day,  before breakfast  Hypoglycemia episodes : no               Hemoglobin A1c has ranged from 6.6%  in 2021, peaking at 9.7 %in 2021. Patient required assistance for hypoglycemia: no Patient has required hospitalization within the last 1 year from hyper or hypoglycemia:   In terms of diet, the patient eats 3  meals and 2snacks a day.   Glucose meter: Accu- check   Denies vomiting, nausea  But has occasional loose stools      HOME DIABETES REGIMEN: Glipizide 10 mg XL daily  Metformin 500 mg 2 tabs BID  Ozempic 0.5 mg weekly      Statin: yes  ACE-I/ARB: Intolerant to lisinopril and losartan  Prior Diabetic Education: no    METER DOWNLOAD SUMMARY: Did not bring     DIABETIC COMPLICATIONS: Microvascular complications:    Denies: CKD, retinopathy, neuropathy   Last eye exam: Completed 2021  Macrovascular complications:    Denies: CAD, PVD, CVA   PAST HISTORY: Past Medical History:  Past Medical History:  Diagnosis Date  . Allergy   . Arthritis   . Barrett's esophagus   . Cancer ([place])    skin cancer  . Chicken pox   . Depression   . Diabetes mellitus without complication ([place])   . Family history of polyps in the colon   . GERD (gastroesophageal reflux disease)   . Hypertension    . Osteopenia   . Sleep apnea    borderline- no cpap use    Past Surgical History:  Past Surgical History:  Procedure Laterality Date  . ABDOMINAL HYSTERECTOMY    . BREAST BIOPSY  2007  . COLONOSCOPY    . KNEE ARTHROSCOPY Left    ~15 yrs ago   . POLYPECTOMY    . TONSILLECTOMY  1949  . UPPER GASTROINTESTINAL ENDOSCOPY        Social History:  reports that she has quit smoking. She has never used smokeless tobacco. She reports that she does not drink alcohol and does not use drugs. Family History:  Family History  Problem Relation Age of Onset  . Breast cancer Mother   . Lung cancer Father   . Liver cancer Father   . Arthritis Sister   . Depression Sister   . Hyperlipidemia Sister   . Hypertension Sister   . Alcohol abuse Brother   . Arthritis Brother   . Depression Brother   . Hyperlipidemia Brother   . Hypertension Brother   . Cancer Daughter   . COPD Daughter   . Depression Son   . Arthritis Maternal Grandmother   . Depression Maternal Grandmother   . Hyperlipidemia Maternal Grandmother   . Hypertension Maternal Grandmother   . Stroke Maternal Grandmother   . Alcohol abuse Maternal Grandfather   .  Arthritis Maternal Grandfather   . Cancer Maternal Grandfather   . Hyperlipidemia Maternal Grandfather   . Hypertension Maternal Grandfather   . Stroke Maternal Grandfather   . Arthritis Paternal Grandmother   . Hyperlipidemia Paternal Grandmother   . Hypertension Paternal Grandmother   . Arthritis Paternal Grandfather   . Alcohol abuse Paternal Grandfather   . Diabetes Paternal Grandfather   . Hyperlipidemia Paternal Grandfather   . Heart disease Paternal Grandfather   . Hearing loss Paternal Grandfather   . Stroke Paternal Grandfather   . Colon polyps Neg Hx   . Esophageal cancer Neg Hx   . Rectal cancer Neg Hx   . Stomach cancer Neg Hx      HOME MEDICATIONS: Allergies as of 11/10/2020      Reactions   Penicillins Anaphylaxis   Whey Protein [protein]  Shortness Of Breath   Lisinopril Other (See Comments), Swelling   Losartan Potassium Swelling   Losartan Nausea And Vomiting      Medication List       Accurate as of November 10, 2020  2:32 PM. If you have any questions, ask your nurse or doctor.        STOP taking these medications   b complex vitamins tablet Stopped by: Dorita Sciara, MD   buPROPion 150 MG 12 hr tablet Commonly known as: WELLBUTRIN SR Stopped by: Dorita Sciara, MD     TAKE these medications   Accu-Chek Guide Me w/Device Kit Use daily for glucose control . Dx E11.9   Accu-Chek Guide test strip Generic drug: glucose blood Test once daily for glucose control.  Dx E11.9   Accu-Chek Softclix Lancets lancets Use once daily Dx E11.9   aspirin 81 MG chewable tablet Chew by mouth daily.   Aspirin Buf(CaCarb-MgCarb-MgO) 81 MG Tabs Take by mouth.   atenolol 50 MG tablet Commonly known as: TENORMIN Take 1 tablet (50 mg total) by mouth daily.   atorvastatin 20 MG tablet Commonly known as: LIPITOR Take 1 tablet (20 mg total) by mouth daily.   CALCIUM-D PO Take by mouth. Caltrate plus D   erythromycin ophthalmic ointment Place one application into both eyes every 6 (six) hours for 5 days.   esomeprazole 20 MG capsule Commonly known as: NEXIUM Take 20 mg by mouth daily at 12 noon.   fluorouracil 5 % cream Commonly known as: EFUDEX   glipiZIDE 10 MG 24 hr tablet Commonly known as: GLUCOTROL XL Take 1 tablet (10 mg total) by mouth every morning.   hydrochlorothiazide 25 MG tablet Commonly known as: HYDRODIURIL Take 1 tablet (25 mg total) by mouth daily.   metFORMIN 500 MG tablet Commonly known as: GLUCOPHAGE Take 2 tablets (1,000 mg total) by mouth 2 (two) times daily with a meal.   multivitamin capsule Take by mouth.   traZODone 150 MG tablet Commonly known as: DESYREL TAKE 1/2 TABLET IN THE EVENING   VITAMIN B 12 PO Take by mouth.   vitamin E 1000 UNIT capsule Take  1,000 Units by mouth daily.   Xiidra 5 % Soln Generic drug: Lifitegr[employer]        ALLERGIES: Allergies  Allergen Reactions  . Penicillins Anaphylaxis  . Whey Protein [Protein] Shortness Of Breath  . Lisinopril Other (See Comments) and Swelling  . Losartan Potassium Swelling  . Losartan Nausea And Vomiting     REVIEW OF SYSTEMS: A comprehensive ROS was conducted with the patient and is negative except as per HPI    OBJECTIVE:  VITAL SIGNS: BP 120/76   Pulse 82   Ht 5' 4" (1.626 m)   Wt 227 lb 8 oz (103.2 kg)   SpO2 98%   BMI 39.05 kg/m    PHYSICAL EXAM:  General: Pt appears well and is in NAD  Neck: General: Supple without adenopathy or carotid bruits. Thyroid: Thyroid size normal.  No goiter or nodules appreciated.  Lungs: Clear with good BS bilat with no rales, rhonchi, or wheezes  Heart: RRR with normal S1 and S2 and no gallops; no murmurs; no rub  Abdomen: Normoactive bowel sounds, soft, nontender, without masses or organomegaly palpable  Extremities:  Lower extremities -trace  pretibial edema. No lesions.  Skin: Normal texture and temperature to palpation. No rash noted.  Neuro: MS is good with appropriate affect, pt is alert and Ox3    DM foot exam: 11/10/2020  The skin of the feet is intact without sores or ulcerations. The pedal pulses are 2+ on right and 2+ on left. The sensation is intact to a screening 5.07, 10 gram monofilament bilaterally   DATA REVIEWED:  Lab Results  Component Value Date   HGBA1C 6.6 (A) 09/09/2020   HGBA1C 8.2 (A) 07/14/2020   HGBA1C 8.0 (A) 04/14/2020     ASSESSMENT / PLAN / RECOMMENDATIONS:   1) Type 2 Diabetes Mellitus, Optimally controlled, Without complications - Most recent A1c of 6.6 %. Goal A1c < 7.0 %.    Plan: GENERAL: I have discussed with the patient the pathophysiology of diabetes. We went over the natural progression of the disease. I explained the complications associated with diabetes including  retinopathy, nephropathy, neuropathy as well as increased risk of cardiovascular disease.  She was on Ozempic 0.5 mg and developed severe GI symptoms, and was advised to stop by her PCP but her daughter accidentally picked another prescription of Ozempic and she would like to give it another try. Pt will start and remain on 0.25 mg weekly dose . She was advised to stop it immediately should vomiting or worsening diarrhea occur Will change glipizide from extended release to regular release and reduce the dose as below I offered to order labs but she is under the impression this will be done with her next appointment at PCP's office  Pt encouraged to bring meter on next visit    MEDICATIONS: - STOP Glipizide 10 mg XL  - Start Glipizide 5 mg, 1 tablet before Breakfast  - Continue Metformin 500 mg , 2 tablets with Breakfast and 2 tablet with supper  - Restart Ozempic 0.25 mg once weekly    EDUCATION / INSTRUCTIONS:  BG monitoring instructions: Patient is instructed to check her blood sugars 1 times a day, fasting .  Call [facility] if: BG persistently < 70  . I reviewed the Rule of 15 for the treatment of hypoglycemia in detail with the patient. Literature supplied.   2) Diabetic complications:   Eye: Does not have known diabetic retinopathy.   Neuro/ Feet: Does not have known diabetic peripheral neuropathy.  Renal: Patient does not have known baseline CKD. She is intolerant to ACEI/ARB .  3) Lipids: Patient is on statins    I spent 45 minutes preparing to see the patient by review of available records, obtaining and reviewing separately obtained history, communicating with the patient, ordering medications, and documenting clinical information in the EHR including the differential Dx, treatment, and any further evaluation and other management   F/U in 3 months     Signed  electronically by: Mack Guise, MD  [facility] End[facility]  [address] [place], [place] [address] Phone: [phone] FAX: [phone]   CC: Isaac Bliss, Rayford Halsted, MD [place] [address] Phone: [phone]  Fax: [phone]    Return to Endocrinology clinic as below: Future Appointments  Date Time Provider [place]  12/13/2020  1:30 PM Isaac Bliss, Rayford Halsted, MD [facility]  01/24/2021 10:00 AM Isaac Bliss, Rayford Halsted, MD [facility]

## 2020-11-10 NOTE — Patient Instructions (Signed)
-   STOP Glipizide 10 mg XL  - Start Glipizide 5 mg, 1 tablet before Breakfast  - Continue Metformin 500 mg , 2 tablets with Breakfast and 2 tablet with supper  - Restart Ozempic 0.25 mg once weekly ( DO NOT INCREASE please)    - Stop Ozempic if you have vomiting, or worsening diarrhea      HOW TO TREAT LOW BLOOD SUGARS (Blood sugar LESS THAN 70 MG/DL)  Please follow the RULE OF 15 for the treatment of hypoglycemia treatment (when your (blood sugars are less than 70 mg/dL)    STEP 1: Take 15 grams of carbohydrates when your blood sugar is low, which includes:   3-4 GLUCOSE TABS  OR  3-4 OZ OF JUICE OR REGULAR SODA OR  ONE TUBE OF GLUCOSE GEL     STEP 2: RECHECK blood sugar in 15 MINUTES STEP 3: If your blood sugar is still low at the 15 minute recheck --> then, go back to STEP 1 and treat AGAIN with another 15 grams of carbohydrates.

## 2020-12-13 ENCOUNTER — Ambulatory Visit: Payer: Medicare PPO | Admitting: Internal Medicine

## 2020-12-19 ENCOUNTER — Telehealth: Payer: Self-pay | Admitting: Internal Medicine

## 2020-12-19 ENCOUNTER — Other Ambulatory Visit: Payer: Self-pay | Admitting: Internal Medicine

## 2020-12-19 ENCOUNTER — Other Ambulatory Visit: Payer: Self-pay

## 2020-12-19 MED ORDER — OZEMPIC (0.25 OR 0.5 MG/DOSE) 2 MG/1.5ML ~~LOC~~ SOPN: 0.2500 mg | PEN_INJECTOR | SUBCUTANEOUS | 2 refills | Status: DC

## 2020-12-19 MED ORDER — OZEMPIC (0.25 OR 0.5 MG/DOSE) 2 MG/1.5ML ~~LOC~~ SOPN: 0.2500 mg | PEN_INJECTOR | SUBCUTANEOUS | 4 refills | Status: DC

## 2020-12-19 NOTE — Telephone Encounter (Deleted)
[facility]

## 2020-12-19 NOTE — Telephone Encounter (Signed)
Ok to sent refill? Last seen on 11/10/2020.

## 2020-12-19 NOTE — Telephone Encounter (Signed)
REFILL FOR:  ozempic   PHARMACY:  [id number] [address] [place], [place] [address]  Phone:  [phone] Fax:  [phone]

## 2020-12-19 NOTE — Addendum Note (Signed)
Addended by: Jacqualin Combes on: 12/19/2020 10:41 AM   Modules accepted: Orders

## 2020-12-20 ENCOUNTER — Encounter: Payer: Self-pay | Admitting: Internal Medicine

## 2020-12-20 ENCOUNTER — Ambulatory Visit (INDEPENDENT_AMBULATORY_CARE_PROVIDER_SITE_OTHER): Payer: Medicare PPO | Admitting: Internal Medicine

## 2020-12-20 VITALS — BP 130/80 | HR 74 | Temp 98.4°F | Wt 223.0 lb

## 2020-12-20 DIAGNOSIS — E538 Deficiency of other specified B group vitamins: Secondary | ICD-10-CM

## 2020-12-20 DIAGNOSIS — F339 Major depressive disorder, recurrent, unspecified: Secondary | ICD-10-CM | POA: Diagnosis not present

## 2020-12-20 DIAGNOSIS — E785 Hyperlipidemia, unspecified: Secondary | ICD-10-CM | POA: Diagnosis not present

## 2020-12-20 DIAGNOSIS — E559 Vitamin D deficiency, unspecified: Secondary | ICD-10-CM | POA: Diagnosis not present

## 2020-12-20 DIAGNOSIS — E1165 Type 2 diabetes mellitus with hyperglycemia: Secondary | ICD-10-CM | POA: Diagnosis not present

## 2020-12-20 DIAGNOSIS — E1169 Type 2 diabetes mellitus with other specified complication: Secondary | ICD-10-CM | POA: Diagnosis not present

## 2020-12-20 LAB — TSH: TSH: 1.88 u[IU]/mL (ref 0.35–4.50)

## 2020-12-20 LAB — CBC WITH DIFFERENTIAL/PLATELET
Basophils Absolute: 0 10*3/uL (ref 0.0–0.1)
Basophils Relative: 0.3 % (ref 0.0–3.0)
Eosinophils Absolute: 0.2 10*3/uL (ref 0.0–0.7)
Eosinophils Relative: 2.7 % (ref 0.0–5.0)
HCT: 40 % (ref 36.0–46.0)
Hemoglobin: 13.5 g/dL (ref 12.0–15.0)
Lymphocytes Relative: 34.6 % (ref 12.0–46.0)
Lymphs Abs: 3 10*3/uL (ref 0.7–4.0)
MCHC: 33.8 g/dL (ref 30.0–36.0)
MCV: 90.9 fl (ref 78.0–100.0)
Monocytes Absolute: 0.7 10*3/uL (ref 0.1–1.0)
Monocytes Relative: 7.8 % (ref 3.0–12.0)
Neutro Abs: 4.7 10*3/uL (ref 1.4–7.7)
Neutrophils Relative %: 54.6 % (ref 43.0–77.0)
Platelets: 236 10*3/uL (ref 150.0–400.0)
RBC: 4.4 Mil/uL (ref 3.87–5.11)
RDW: 12.9 % (ref 11.5–15.5)
WBC: 8.6 10*3/uL (ref 4.0–10.5)

## 2020-12-20 LAB — COMPREHENSIVE METABOLIC PANEL
ALT: 32 U/L (ref 0–35)
AST: 40 U/L — ABNORMAL HIGH (ref 0–37)
Albumin: 4.2 g/dL (ref 3.5–5.2)
Alkaline Phosphatase: 70 U/L (ref 39–117)
BUN: 13 mg/dL (ref 6–23)
CO2: 28 mEq/L (ref 19–32)
Calcium: 9.8 mg/dL (ref 8.4–10.5)
Chloride: 99 mEq/L (ref 96–112)
Creatinine, Ser: 0.92 mg/dL (ref 0.40–1.20)
GFR: 60.27 mL/min (ref >60.00)
Glucose, Bld: 66 mg/dL — ABNORMAL LOW (ref 70–99)
Potassium: 4.5 mEq/L (ref 3.5–5.1)
Sodium: 139 mEq/L (ref 135–145)
Total Bilirubin: 0.5 mg/dL (ref 0.2–1.2)
Total Protein: 7 g/dL (ref 6.0–8.3)

## 2020-12-20 LAB — LIPID PANEL
Cholesterol: 121 mg/dL (ref 0–200)
HDL: 52.1 mg/dL (ref >39.00)
LDL Cholesterol: 42 mg/dL (ref 0–99)
NonHDL: 68.86
Total CHOL/HDL Ratio: 2
Triglycerides: 135 mg/dL (ref 0.0–149.0)
VLDL: 27 mg/dL (ref 0.0–40.0)

## 2020-12-20 LAB — VITAMIN D 25 HYDROXY (VIT D DEFICIENCY, FRACTURES): VITD: 35.46 ng/mL (ref 30.00–100.00)

## 2020-12-20 LAB — VITAMIN B12: Vitamin B-12: 282 pg/mL (ref 211–911)

## 2020-12-20 NOTE — Patient Instructions (Signed)
-  Nice seeing you today!!  -Lab work today; will notify you once results are available.  -Schedule follow up in 4 months for your physical.

## 2020-12-20 NOTE — Progress Notes (Signed)
   Established Patient Office Visit     This visit occurred during the SARS-CoV-2 public health emergency.  Safety protocols were in place, including screening questions prior to the visit, additional usage of staff PPE, and extensive cleaning of exam room while observing appropriate contact time as indicated for disinfecting solutions.    CC/Reason for Visit: Follow-up chronic conditions  HPI: Diane Decker is a 75 y.o. female who is coming in today for the above mentioned reasons. Past Medical History is significant for: Hypertension, hyperlipidemia, GERD, type 2 diabetes.  She has started seeing an endocrinologist and her most recent A1c was 6.1.  She continues to lose weight which is fantastic.  She has had all of her Covid vaccinations.  Her blood pressure remains well controlled.  She has no acute concerns today.   Past Medical/Surgical History: Past Medical History:  Diagnosis Date  . Allergy   . Arthritis   . Barrett's esophagus   . Cancer (HCC)    skin cancer  . Chicken pox   . Depression   . Diabetes mellitus without complication (HCC)   . Family history of polyps in the colon   . GERD (gastroesophageal reflux disease)   . Hypertension   . Osteopenia   . Sleep apnea    borderline- no cpap use     Past Surgical History:  Procedure Laterality Date  . ABDOMINAL HYSTERECTOMY    . BREAST BIOPSY  2007  . COLONOSCOPY    . KNEE ARTHROSCOPY Left    ~15 yrs ago   . POLYPECTOMY    . TONSILLECTOMY  1949  . UPPER GASTROINTESTINAL ENDOSCOPY      Social History:  reports that she has quit smoking. She has never used smokeless tobacco. She reports that she does not drink alcohol and does not use drugs.  Allergies: Allergies  Allergen Reactions  . Penicillins Anaphylaxis  . Whey Protein [Protein] Shortness Of Breath  . Lisinopril Other (See Comments) and Swelling  . Losartan Potassium Swelling  . Losartan Nausea And Vomiting    Family History:   Family History  Problem Relation Age of Onset  . Breast cancer Mother   . Lung cancer Father   . Liver cancer Father   . Arthritis Sister   . Depression Sister   . Hyperlipidemia Sister   . Hypertension Sister   . Alcohol abuse Brother   . Arthritis Brother   . Depression Brother   . Hyperlipidemia Brother   . Hypertension Brother   . Cancer Daughter   . COPD Daughter   . Depression Son   . Arthritis Maternal Grandmother   . Depression Maternal Grandmother   . Hyperlipidemia Maternal Grandmother   . Hypertension Maternal Grandmother   . Stroke Maternal Grandmother   . Alcohol abuse Maternal Grandfather   . Arthritis Maternal Grandfather   . Cancer Maternal Grandfather   . Hyperlipidemia Maternal Grandfather   . Hypertension Maternal Grandfather   . Stroke Maternal Grandfather   . Arthritis Paternal Grandmother   . Hyperlipidemia Paternal Grandmother   . Hypertension Paternal Grandmother   . Arthritis Paternal Grandfather   . Alcohol abuse Paternal Grandfather   . Diabetes Paternal Grandfather   . Hyperlipidemia Paternal Grandfather   . Heart disease Paternal Grandfather   . Hearing loss Paternal Grandfather   . Stroke Paternal Grandfather   . Colon polyps Neg Hx   . Esophageal cancer Neg Hx   . Rectal cancer Neg Hx   . Stomach   cancer Neg Hx      Current Outpatient Medications:  .  Accu-Chek Softclix Lancets lancets, Use once daily Dx E11.9, Disp: 100 each, Rfl: 0 .  aspirin 81 MG chewable tablet, Chew by mouth daily., Disp: , Rfl:  .  Aspirin Buf,CaCarb-MgCarb-MgO, 81 MG TABS, Take by mouth., Disp: , Rfl:  .  atenolol (TENORMIN) 50 MG tablet, Take 1 tablet (50 mg total) by mouth daily., Disp: 90 tablet, Rfl: 1 .  atorvastatin (LIPITOR) 20 MG tablet, Take 1 tablet (20 mg total) by mouth daily., Disp: 90 tablet, Rfl: 1 .  Blood Glucose Monitoring Suppl (ACCU-CHEK GUIDE ME) w/Device KIT, Use daily for glucose control . Dx E11.9, Disp: 1 kit, Rfl: 0 .  Calcium  Carbonate-Vitamin D (CALCIUM-D PO), Take by mouth. Caltrate plus D, Disp: , Rfl:  .  Cyanocobalamin (VITAMIN B 12 PO), Take by mouth., Disp: , Rfl:  .  erythromycin ophthalmic ointment, Place one application into both eyes every 6 (six) hours for 5 days., Disp: , Rfl:  .  esomeprazole (NEXIUM) 20 MG capsule, Take 20 mg by mouth daily at 12 noon., Disp: , Rfl:  .  glipiZIDE (GLUCOTROL) 5 MG tablet, Take 1 tablet (5 mg total) by mouth daily before breakfast., Disp: 90 tablet, Rfl: 3 .  hydrochlorothiazide (HYDRODIURIL) 25 MG tablet, Take 1 tablet (25 mg total) by mouth daily., Disp: 90 tablet, Rfl: 1 .  metFORMIN (GLUCOPHAGE) 500 MG tablet, Take 2 tablets (1,000 mg total) by mouth 2 (two) times daily with a meal., Disp: 360 tablet, Rfl: 1 .  Multiple Vitamin (MULTIVITAMIN) capsule, Take by mouth., Disp: , Rfl:  .  Semaglutide,0.25 or 0.5MG/DOS, (OZEMPIC, 0.25 OR 0.5 MG/DOSE,) 2 MG/1.5ML SOPN, Inject 0.25 mg into the skin once a week., Disp: 3 mL, Rfl: 4 .  traZODone (DESYREL) 150 MG tablet, TAKE 1/2 TABLET IN THE EVENING, Disp: 45 tablet, Rfl: 1 .  vitamin E 1000 UNIT capsule, Take 1,000 Units by mouth daily., Disp: , Rfl:  .  XIIDRA 5 % SOLN, , Disp: , Rfl:  .  glucose blood (ACCU-CHEK GUIDE) test strip, Test once daily for glucose control.  Dx E11.9, Disp: 100 each, Rfl: 12  Review of Systems:  Constitutional: Denies fever, chills, diaphoresis, appetite change and fatigue.  HEENT: Denies photophobia, eye pain, redness, hearing loss, ear pain, congestion, sore throat, rhinorrhea, sneezing, mouth sores, trouble swallowing, neck pain, neck stiffness and tinnitus.   Respiratory: Denies SOB, DOE, cough, chest tightness,  and wheezing.   Cardiovascular: Denies chest pain, palpitations and leg swelling.  Gastrointestinal: Denies nausea, vomiting, abdominal pain, diarrhea, constipation, blood in stool and abdominal distention.  Genitourinary: Denies dysuria, urgency, frequency, hematuria, flank pain and  difficulty urinating.  Endocrine: Denies: hot or cold intolerance, sweats, changes in hair or nails, polyuria, polydipsia. Musculoskeletal: Denies myalgias, back pain, joint swelling, arthralgias and gait problem.  Skin: Denies pallor, rash and wound.  Neurological: Denies dizziness, seizures, syncope, weakness, light-headedness, numbness and headaches.  Hematological: Denies adenopathy. Easy bruising, personal or family bleeding history  Psychiatric/Behavioral: Denies suicidal ideation, mood changes, confusion, nervousness, sleep disturbance and agitation    Physical Exam: Vitals:   12/20/20 1136  BP: 130/80  Pulse: 74  Temp: 98.4 F (36.9 C)  TempSrc: Oral  SpO2: 96%  Weight: 223 lb (101.2 kg)    Body mass index is 38.28 kg/m.   Constitutional: NAD, calm, comfortable, obese Eyes: PERRL, lids and conjunctivae normal, wears corrective lenses ENMT: Mucous membranes are moist. Respiratory: clear   to auscultation bilaterally, no wheezing, no crackles. Normal respiratory effort. No accessory muscle use.  Cardiovascular: Regular rate and rhythm, no murmurs / rubs / gallops. No extremity edema.   Neurologic: Grossly intact and nonfocal Psychiatric: Normal judgment and insight. Alert and oriented x 3. Normal mood.    Impression and Plan:  Uncontrolled type 2 diabetes mellitus with hyperglycemia (HCC)  -Well-controlled with a recent A1c of 6.1, she is now followed by endocrinology.  Hyperlipidemia associated with type 2 diabetes mellitus (HCC)  - Plan: Lipid panel -She is on atorvastatin 20 mg daily, no recent LDL measurement.  Morbid obesity (HCC) -Discussed healthy lifestyle, including increased physical activity and better food choices to promote weight loss.  Vitamin D deficiency - Plan: VITAMIN D 25 Hydroxy (Vit-D Deficiency, Fractures)  B12 deficiency  - Plan: Vitamin B12  Depression, recurrent (HCC)  - Plan: TSH   Patient Instructions  -Nice seeing you  today!!  -Lab work today; will notify you once results are available.  -Schedule follow up in 4 months for your physical.     Estela Hernandez Acosta, MD [facility]   

## 2021-01-20 ENCOUNTER — Other Ambulatory Visit: Payer: Self-pay

## 2021-01-20 ENCOUNTER — Ambulatory Visit: Payer: Medicare PPO | Admitting: Internal Medicine

## 2021-01-20 ENCOUNTER — Encounter: Payer: Self-pay | Admitting: Internal Medicine

## 2021-01-20 VITALS — BP 110/74 | HR 72 | Temp 97.2°F | Wt 226.2 lb

## 2021-01-20 DIAGNOSIS — H9193 Unspecified hearing loss, bilateral: Secondary | ICD-10-CM

## 2021-01-20 NOTE — Progress Notes (Signed)
Acute office Visit     This visit occurred during the SARS-CoV-2 public health emergency.  Safety protocols were in place, including screening questions prior to the visit, additional usage of staff PPE, and extensive cleaning of exam room while observing appropriate contact time as indicated for disinfecting solutions.    CC/Reason for Visit: Bilateral hearing loss  HPI: Diane Decker is a 75 y.o. female who is coming in today for the above mentioned reasons.  She is here today accompanied by her daughter.  Her daughter made the appointment concerns regarding her hearing.  They were at a restaurant a few weeks ago and her mother could not hear her from across the booth due to the ambient noise in the restaurant.  Patient has been using hearing aids from her husband who has passed.  She feels like the hearing aids are not effective for her.  Past Medical/Surgical History: Past Medical History:  Diagnosis Date  . Allergy   . Arthritis   . Barrett's esophagus   . Cancer ([place])    skin cancer  . Chicken pox   . Depression   . Diabetes mellitus without complication ([place])   . Family history of polyps in the colon   . GERD (gastroesophageal reflux disease)   . Hypertension   . Osteopenia   . Sleep apnea    borderline- no cpap use     Past Surgical History:  Procedure Laterality Date  . ABDOMINAL HYSTERECTOMY    . BREAST BIOPSY  2007  . COLONOSCOPY    . KNEE ARTHROSCOPY Left    ~15 yrs ago   . POLYPECTOMY    . TONSILLECTOMY  1949  . UPPER GASTROINTESTINAL ENDOSCOPY      Social History:  reports that she has quit smoking. She has never used smokeless tobacco. She reports that she does not drink alcohol and does not use drugs.  Allergies: Allergies  Allergen Reactions  . Penicillins Anaphylaxis  . Whey Protein [Protein] Shortness Of Breath  . Lisinopril Other (See Comments) and Swelling  . Losartan Potassium Swelling  . Losartan Nausea And Vomiting     Family History:  Family History  Problem Relation Age of Onset  . Breast cancer Mother   . Lung cancer Father   . Liver cancer Father   . Arthritis Sister   . Depression Sister   . Hyperlipidemia Sister   . Hypertension Sister   . Alcohol abuse Brother   . Arthritis Brother   . Depression Brother   . Hyperlipidemia Brother   . Hypertension Brother   . Cancer Daughter   . COPD Daughter   . Depression Son   . Arthritis Maternal Grandmother   . Depression Maternal Grandmother   . Hyperlipidemia Maternal Grandmother   . Hypertension Maternal Grandmother   . Stroke Maternal Grandmother   . Alcohol abuse Maternal Grandfather   . Arthritis Maternal Grandfather   . Cancer Maternal Grandfather   . Hyperlipidemia Maternal Grandfather   . Hypertension Maternal Grandfather   . Stroke Maternal Grandfather   . Arthritis Paternal Grandmother   . Hyperlipidemia Paternal Grandmother   . Hypertension Paternal Grandmother   . Arthritis Paternal Grandfather   . Alcohol abuse Paternal Grandfather   . Diabetes Paternal Grandfather   . Hyperlipidemia Paternal Grandfather   . Heart disease Paternal Grandfather   . Hearing loss Paternal Grandfather   . Stroke Paternal Grandfather   . Colon polyps Neg Hx   . Esophageal cancer  Neg Hx   . Rectal cancer Neg Hx   . Stomach cancer Neg Hx      Current Outpatient Medications:  .  Accu-Chek Softclix Lancets lancets, Use once daily Dx E11.9, Disp: 100 each, Rfl: 0 .  aspirin 81 MG chewable tablet, Chew by mouth daily., Disp: , Rfl:  .  Aspirin Buf,CaCarb-MgCarb-MgO, 81 MG TABS, Take by mouth., Disp: , Rfl:  .  atenolol (TENORMIN) 50 MG tablet, Take 1 tablet (50 mg total) by mouth daily., Disp: 90 tablet, Rfl: 1 .  atorvastatin (LIPITOR) 20 MG tablet, Take 1 tablet (20 mg total) by mouth daily., Disp: 90 tablet, Rfl: 1 .  Blood Glucose Monitoring Suppl (ACCU-CHEK GUIDE ME) w/Device KIT, Use daily for glucose control . Dx E11.9, Disp: 1 kit,  Rfl: 0 .  Calcium Carbonate-Vitamin D (CALCIUM-D PO), Take by mouth. Caltrate plus D, Disp: , Rfl:  .  Cyanocobalamin (VITAMIN B 12 PO), Take by mouth., Disp: , Rfl:  .  erythromycin ophthalmic ointment, Place one application into both eyes every 6 (six) hours for 5 days., Disp: , Rfl:  .  esomeprazole (NEXIUM) 20 MG capsule, Take 20 mg by mouth daily at 12 noon., Disp: , Rfl:  .  glipiZIDE (GLUCOTROL) 5 MG tablet, Take 1 tablet (5 mg total) by mouth daily before breakfast., Disp: 90 tablet, Rfl: 3 .  glucose blood (ACCU-CHEK GUIDE) test strip, Test once daily for glucose control.  Dx E11.9, Disp: 100 each, Rfl: 12 .  hydrochlorothiazide (HYDRODIURIL) 25 MG tablet, Take 1 tablet (25 mg total) by mouth daily., Disp: 90 tablet, Rfl: 1 .  Multiple Vitamin (MULTIVITAMIN) capsule, Take by mouth., Disp: , Rfl:  .  Semaglutide,0.25 or 0.5MG/DOS, (OZEMPIC, 0.25 OR 0.5 MG/DOSE,) 2 MG/1.5ML SOPN, Inject 0.25 mg into the skin once a week., Disp: 3 mL, Rfl: 4 .  traZODone (DESYREL) 150 MG tablet, TAKE 1/2 TABLET IN THE EVENING, Disp: 45 tablet, Rfl: 1 .  vitamin E 1000 UNIT capsule, Take 1,000 Units by mouth daily., Disp: , Rfl:  .  XIIDRA 5 % SOLN, , Disp: , Rfl:  .  metFORMIN (GLUCOPHAGE) 500 MG tablet, Take 2 tablets (1,000 mg total) by mouth 2 (two) times daily with a meal., Disp: 360 tablet, Rfl: 1  Review of Systems:  Constitutional: Denies fever, chills, diaphoresis, appetite change and fatigue.  HEENT: Denies photophobia, eye pain, redness,  ear pain, congestion, sore throat, rhinorrhea, sneezing, mouth sores, trouble swallowing, neck pain, neck stiffness and tinnitus.   Respiratory: Denies SOB, DOE, cough, chest tightness,  and wheezing.   Cardiovascular: Denies chest pain, palpitations and leg swelling.  Gastrointestinal: Denies nausea, vomiting, abdominal pain, diarrhea, constipation, blood in stool and abdominal distention.  Genitourinary: Denies dysuria, urgency, frequency, hematuria, flank  pain and difficulty urinating.  Endocrine: Denies: hot or cold intolerance, sweats, changes in hair or nails, polyuria, polydipsia. Musculoskeletal: Denies myalgias, back pain, joint swelling, arthralgias and gait problem.  Skin: Denies pallor, rash and wound.  Neurological: Denies dizziness, seizures, syncope, weakness, light-headedness, numbness and headaches.  Hematological: Denies adenopathy. Easy bruising, personal or family bleeding history  Psychiatric/Behavioral: Denies suicidal ideation, mood changes, confusion, nervousness, sleep disturbance and agitation    Physical Exam: Vitals:   01/20/21 1519  BP: 110/74  Pulse: 72  Temp: (!) 97.2 F (36.2 C)  TempSrc: Oral  SpO2: 97%  Weight: 226 lb 3.2 oz (102.6 kg)    Body mass index is 38.83 kg/m.   Constitutional: NAD, calm, comfortable Eyes: PERRL,  lids and conjunctivae normal, wears corrective lenses ENMT: Mucous membranes are moist.Tympanic membrane is pearly white, no erythema or bulging bilaterally. Neurologic: Grossly intact and nonfocal Psychiatric: Normal judgment and insight. Alert and oriented x 3. Normal mood.    Impression and Plan:  Bilateral hearing loss, unspecified hearing loss type -No evidence cerumen impaction on exam. -I will refer to audiology for formal hearing test and for her to be fitted for hearing aids if needed.     Lelon Frohlich, MD [facility]

## 2021-01-24 ENCOUNTER — Ambulatory Visit: Payer: Medicare PPO | Admitting: Internal Medicine

## 2021-02-07 DIAGNOSIS — H903 Sensorineural hearing loss, bilateral: Secondary | ICD-10-CM | POA: Diagnosis not present

## 2021-02-07 DIAGNOSIS — E119 Type 2 diabetes mellitus without complications: Secondary | ICD-10-CM | POA: Diagnosis not present

## 2021-02-07 DIAGNOSIS — Z77122 Contact with and (suspected) exposure to noise: Secondary | ICD-10-CM | POA: Diagnosis not present

## 2021-02-08 ENCOUNTER — Other Ambulatory Visit: Payer: Self-pay | Admitting: Internal Medicine

## 2021-02-08 DIAGNOSIS — E1165 Type 2 diabetes mellitus with hyperglycemia: Secondary | ICD-10-CM

## 2021-02-09 ENCOUNTER — Other Ambulatory Visit: Payer: Self-pay

## 2021-02-09 ENCOUNTER — Encounter: Payer: Self-pay | Admitting: Internal Medicine

## 2021-02-09 ENCOUNTER — Ambulatory Visit: Payer: Medicare PPO | Admitting: Internal Medicine

## 2021-02-09 VITALS — BP 126/82 | HR 78 | Ht 64.0 in | Wt 224.5 lb

## 2021-02-09 DIAGNOSIS — E119 Type 2 diabetes mellitus without complications: Secondary | ICD-10-CM

## 2021-02-09 LAB — POCT GLYCOSYLATED HEMOGLOBIN (HGB A1C): Hemoglobin A1C: 6.7 % — AB (ref 4.0–5.6)

## 2021-02-09 LAB — MICROALBUMIN / CREATININE URINE RATIO
Creatinine,U: 80.8 mg/dL
Microalb Creat Ratio: 2.1 mg/g (ref 0.0–30.0)
Microalb, Ur: 1.7 mg/dL (ref 0.0–1.9)

## 2021-02-09 MED ORDER — OZEMPIC (0.25 OR 0.5 MG/DOSE) 2 MG/1.5ML ~~LOC~~ SOPN: 0.5000 mg | PEN_INJECTOR | SUBCUTANEOUS | 3 refills | Status: DC

## 2021-02-09 NOTE — Patient Instructions (Signed)
-   Continue  Glipizide 5 mg, 1 tablet before Breakfast  - Continue Metformin 500 mg , 2 tablets with Breakfast and 2 tablet with supper  - Increase  Ozempic 0.5 mg once weekly     HOW TO TREAT LOW BLOOD SUGARS (Blood sugar LESS THAN 70 MG/DL)  Please follow the RULE OF 15 for the treatment of hypoglycemia treatment (when your (blood sugars are less than 70 mg/dL)    STEP 1: Take 15 grams of carbohydrates when your blood sugar is low, which includes:   3-4 GLUCOSE TABS  OR  3-4 OZ OF JUICE OR REGULAR SODA OR  ONE TUBE OF GLUCOSE GEL     STEP 2: RECHECK blood sugar in 15 MINUTES STEP 3: If your blood sugar is still low at the 15 minute recheck --> then, go back to STEP 1 and treat AGAIN with another 15 grams of carbohydrates.

## 2021-02-09 NOTE — Progress Notes (Signed)
Name: Diane Decker  Age/ Sex: 75 y.o., female   MRN/ DOB: [id number], 04/15/1946     PCP: Isaac Bliss, Rayford Halsted, MD   Reason for Endocrinology Evaluation: Type 2 Diabetes Mellitus  Initial Endocrine Consultative Visit: 11/10/2020    PATIENT IDENTIFIER: [name] is a 75 y.o. female with a past medical history of T2DM, barrett's esophagus. The patient has followed with Endocrinology clinic since 11/10/2020 for consultative assistance with management of her diabetes.  DIABETIC HISTORY:  Diane Decker was diagnosed with DM at age 61, ozempic caused GI side effects. . Her hemoglobin A1c has ranged from 6.6%  in 2021, peaking at 9.7 %in 2021.    On her initial visit to our clinic her A1c was 6.6 % she was on Glipizide XL, Metformin and wanted to give ozempic another try. We swiched XL glipizide to regular release     SUBJECTIVE:   During the last visit (11/10/2020): A1c 6.6% switched Glipizide to regular release, restarted ozempic and continued metformin   Today (02/09/2021): Diane Decker  She checks her blood sugars occasionally  times daily, preprandial to breakfast. The patient has not  had hypoglycemic episodes since the last clinic visit.  Denies nausea or diarrhea   HOME DIABETES REGIMEN:  Glipizide 5 mg, 1 tablet before Breakfast  Metformin 500 mg , 2 tablets with Breakfast and 2 tablet with supper  Ozempic 0.25 mg once weekly     Statin: yes  ACE-I/ARB: Intolerant to lisinopril and losartan    METER DOWNLOAD SUMMARY: 4/7-4/21/2022 Average Number Tests/Day = 0.1 Overall Mean FS Glucose = 129 Standard Deviation = 0  BG Ranges: Low = 129 High = 129   Hypoglycemic Events/30 Days: BG < 50 = 0 Episodes of symptomatic severe hypoglycemia = 0   DIABETIC COMPLICATIONS: Microvascular complications:    Denies: CKD, retinopathy, neuropathy   Last Eye Exam: Completed 2021  Macrovascular complications:    Denies: CAD,  CVA, PVD   HISTORY:  Past Medical History:  Past Medical History:  Diagnosis Date  . Allergy   . Arthritis   . Barrett's esophagus   . Cancer ([place])    skin cancer  . Chicken pox   . Depression   . Diabetes mellitus without complication ([place])   . Family history of polyps in the colon   . GERD (gastroesophageal reflux disease)   . Hypertension   . Osteopenia   . Sleep apnea    borderline- no cpap use    Past Surgical History:  Past Surgical History:  Procedure Laterality Date  . ABDOMINAL HYSTERECTOMY    . BREAST BIOPSY  2007  . COLONOSCOPY    . KNEE ARTHROSCOPY Left    ~15 yrs ago   . POLYPECTOMY    . TONSILLECTOMY  1949  . UPPER GASTROINTESTINAL ENDOSCOPY      Social History:  reports that she has quit smoking. She has never used smokeless tobacco. She reports that she does not drink alcohol and does not use drugs. Family History:  Family History  Problem Relation Age of Onset  . Breast cancer Mother   . Lung cancer Father   . Liver cancer Father   . Arthritis Sister   . Depression Sister   . Hyperlipidemia Sister   . Hypertension Sister   . Alcohol abuse Brother   . Arthritis Brother   . Depression Brother   . Hyperlipidemia Brother   . Hypertension Brother   . Cancer Daughter   . COPD  Daughter   . Depression Son   . Arthritis Maternal Grandmother   . Depression Maternal Grandmother   . Hyperlipidemia Maternal Grandmother   . Hypertension Maternal Grandmother   . Stroke Maternal Grandmother   . Alcohol abuse Maternal Grandfather   . Arthritis Maternal Grandfather   . Cancer Maternal Grandfather   . Hyperlipidemia Maternal Grandfather   . Hypertension Maternal Grandfather   . Stroke Maternal Grandfather   . Arthritis Paternal Grandmother   . Hyperlipidemia Paternal Grandmother   . Hypertension Paternal Grandmother   . Arthritis Paternal Grandfather   . Alcohol abuse Paternal Grandfather   . Diabetes Paternal Grandfather   . Hyperlipidemia  Paternal Grandfather   . Heart disease Paternal Grandfather   . Hearing loss Paternal Grandfather   . Stroke Paternal Grandfather   . Colon polyps Neg Hx   . Esophageal cancer Neg Hx   . Rectal cancer Neg Hx   . Stomach cancer Neg Hx      HOME MEDICATIONS: Allergies as of 02/09/2021      Reactions   Penicillins Anaphylaxis   Whey Protein [protein] Shortness Of Breath   Lisinopril Other (See Comments), Swelling   Losartan Potassium Swelling   Losartan Nausea And Vomiting      Medication List       Accurate as of February 09, 2021 11:37 AM. If you have any questions, ask your nurse or doctor.        Accu-Chek Guide Me w/Device Kit Use daily for glucose control . Dx E11.9   Accu-Chek Guide test strip Generic drug: glucose blood Test once daily for glucose control.  Dx E11.9   Accu-Chek Softclix Lancets lancets Use once daily Dx E11.9   aspirin 81 MG chewable tablet Chew by mouth daily.   Aspirin Buf(CaCarb-MgCarb-MgO) 81 MG Tabs Take by mouth.   atenolol 50 MG tablet Commonly known as: TENORMIN TAKE 1 TABLET BY MOUTH EVERY DAY   atorvastatin 20 MG tablet Commonly known as: LIPITOR TAKE 1 TABLET BY MOUTH EVERY DAY   CALCIUM-D PO Take by mouth. Caltrate plus D   erythromycin ophthalmic ointment Place one application into both eyes every 6 (six) hours for 5 days.   esomeprazole 20 MG capsule Commonly known as: NEXIUM Take 20 mg by mouth daily at 12 noon.   glipiZIDE 5 MG tablet Commonly known as: GLUCOTROL Take 1 tablet (5 mg total) by mouth daily before breakfast.   hydrochlorothiazide 25 MG tablet Commonly known as: HYDRODIURIL Take 1 tablet (25 mg total) by mouth daily.   metFORMIN 500 MG tablet Commonly known as: GLUCOPHAGE TAKE 2 TABLETS (1,000 MG TOTAL) BY MOUTH 2 (TWO) TIMES DAILY WITH A MEAL.   multivitamin capsule Take by mouth.   Ozempic (0.25 or 0.5 MG/DOSE) 2 MG/1.5ML Sopn Generic drug: Semaglutide(0.25 or 0.5MG /DOS) Inject 0.25 mg into  the skin once a week.   traZODone 150 MG tablet Commonly known as: DESYREL TAKE 1/2 TABLET IN THE EVENING   VITAMIN B 12 PO Take by mouth.   vitamin E 1000 UNIT capsule Take 1,000 Units by mouth daily.   Xiidra 5 % Soln Generic drug: Lifitegrast        OBJECTIVE:   Vital Signs: BP 126/82   Pulse 78   Ht 5\' 4"  (1.626 m)   Wt 224 lb 8 oz (101.8 kg)   SpO2 98%   BMI 38.54 kg/m   Wt Readings from Last 3 Encounters:  02/09/21 224 lb 8 oz (101.8 kg)  01/20/21  226 lb 3.2 oz (102.6 kg)  12/20/20 223 lb (101.2 kg)     Exam: General: Pt appears well and is in NAD  Lungs: Clear with good BS bilat   Heart: RRR   Extremities: No pretibial edema.   Neuro: MS is good with appropriate affect, pt is alert and Ox3   DM foot exam: 11/10/2020  The skin of the feet is intact without sores or ulcerations. The pedal pulses are 2+ on right and 2+ on left. The sensation is intact to a screening 5.07, 10 gram monofilament bilaterally     DATA REVIEWED:  Lab Results  Component Value Date   HGBA1C 6.7 (A) 02/09/2021   HGBA1C 6.1 11/08/2020   HGBA1C 6.6 (A) 09/09/2020   Lab Results  Component Value Date   LDLCALC 42 12/20/2020   CREATININE 0.92 12/20/2020    Lab Results  Component Value Date   CHOL 121 12/20/2020   HDL 52.10 12/20/2020   LDLCALC 42 12/20/2020   TRIG 135.0 12/20/2020   CHOLHDL 2 12/20/2020         ASSESSMENT / PLAN / RECOMMENDATIONS:   1) Type 2 Diabetes Mellitus, Optimally controlled, Without complications - Most recent A1c of 6.7  %. Goal A1c < 7.0 %.    -Her A1c is at goal, there is no evidence of hypoglycemia -She is tolerating Ozempic without any GI side effects, we discussed increasing this to 0.5 mg weekly  MEDICATIONS:  Continue glipizide 5 mg, 1 tablet before breakfast  Continue metformin 500 mg, 2 tablets twice daily  Increase Ozempic to 0.5 mg weekly  EDUCATION / INSTRUCTIONS:  BG monitoring instructions: Patient is instructed  to check her blood sugars 1 times a day, fsting  Call [facility] if: BG persistently < 70  . I reviewed the Rule of 15 for the treatment of hypoglycemia in detail with the patient. Literature supplied.   2) Diabetic complications:   Eye: Does not have known diabetic retinopathy.   Neuro/ Feet: Does not have known diabetic peripheral neuropathy .  Renal: Patient does not have known baseline CKD. She   is intolerant to  ACEI/ARB     F/U in 4 months   Signed electronically by: Mack Guise, MD  [facility]  [address] [place], [place] [address] Phone: [phone] FAX: [phone]   CC: Isaac Bliss, Rayford Halsted, MD [place] [address] Phone: [phone]  Fax: [phone]  Return to Endocrinology clinic as below: Future Appointments  Date Time Provider [place]  05/03/2021  8:00 AM Isaac Bliss, Rayford Halsted, MD [id number]

## 2021-02-10 ENCOUNTER — Encounter: Payer: Self-pay | Admitting: Internal Medicine

## 2021-03-17 ENCOUNTER — Other Ambulatory Visit: Payer: Self-pay | Admitting: Internal Medicine

## 2021-03-17 DIAGNOSIS — Z1231 Encounter for screening mammogram for malignant neoplasm of breast: Secondary | ICD-10-CM

## 2021-05-02 ENCOUNTER — Other Ambulatory Visit: Payer: Self-pay

## 2021-05-03 ENCOUNTER — Encounter: Payer: Self-pay | Admitting: Internal Medicine

## 2021-05-03 ENCOUNTER — Ambulatory Visit (INDEPENDENT_AMBULATORY_CARE_PROVIDER_SITE_OTHER): Payer: Medicare PPO | Admitting: Internal Medicine

## 2021-05-03 VITALS — BP 120/80 | HR 71 | Temp 98.0°F | Ht 62.0 in | Wt 227.4 lb

## 2021-05-03 DIAGNOSIS — E1165 Type 2 diabetes mellitus with hyperglycemia: Secondary | ICD-10-CM | POA: Diagnosis not present

## 2021-05-03 DIAGNOSIS — I10 Essential (primary) hypertension: Secondary | ICD-10-CM

## 2021-05-03 DIAGNOSIS — E785 Hyperlipidemia, unspecified: Secondary | ICD-10-CM | POA: Diagnosis not present

## 2021-05-03 DIAGNOSIS — F339 Major depressive disorder, recurrent, unspecified: Secondary | ICD-10-CM | POA: Diagnosis not present

## 2021-05-03 DIAGNOSIS — E1169 Type 2 diabetes mellitus with other specified complication: Secondary | ICD-10-CM

## 2021-05-03 DIAGNOSIS — D126 Benign neoplasm of colon, unspecified: Secondary | ICD-10-CM

## 2021-05-03 DIAGNOSIS — Z0000 Encounter for general adult medical examination without abnormal findings: Secondary | ICD-10-CM | POA: Diagnosis not present

## 2021-05-03 NOTE — Progress Notes (Signed)
Established Patient Office Visit     This visit occurred during the SARS-CoV-2 public health emergency.  Safety protocols were in place, including screening questions prior to the visit, additional usage of staff PPE, and extensive cleaning of exam room while observing appropriate contact time as indicated for disinfecting solutions.    CC/Reason for Visit: Annual preventive exam and subsequent Medicare wellness visit  HPI: Diane Decker is a 75 y.o. female who is coming in today for the above mentioned reasons. Past Medical History is significant for: Hypertension, hyperlipidemia, GERD and Barrett's esophagus and type 2 diabetes now followed by endocrinology.  Her most recent A1c was 6.7 in April.  She also has multiple adenomatous polyps, last colonoscopy was in May 2021 and she was advised a 1 year follow-up for which she is now overdue.  She has had 3 COVID vaccines, is overdue for Tdap, shingles and pneumonia vaccination.  She agrees to all except pneumonia.  She has routine eye and dental care.  Negative retinopathy was reported on her last eye exam in December 2021.  She does not exercise routinely.  She was just fitted for hearing aids.  She no longer does Pap smears due to age.  She has her mammogram scheduled for next week.   Past Medical/Surgical History: Past Medical History:  Diagnosis Date   Allergy    Arthritis    Barrett's esophagus    Cancer ([place])    skin cancer   Chicken pox    Depression    Diabetes mellitus without complication ([place])    Family history of polyps in the colon    GERD (gastroesophageal reflux disease)    Hypertension    Osteopenia    Sleep apnea    borderline- no cpap use     Past Surgical History:  Procedure Laterality Date   ABDOMINAL HYSTERECTOMY     BREAST BIOPSY  2007   COLONOSCOPY     KNEE ARTHROSCOPY Left    ~15 yrs ago    POLYPECTOMY     TONSILLECTOMY  1949   UPPER GASTROINTESTINAL ENDOSCOPY      Social  History:  reports that she has quit smoking. She has never used smokeless tobacco. She reports that she does not drink alcohol and does not use drugs.  Allergies: Allergies  Allergen Reactions   Penicillins Anaphylaxis   Whey Protein [Protein] Shortness Of Breath   Lisinopril Other (See Comments) and Swelling   Losartan Potassium Swelling   Losartan Nausea And Vomiting    Family History:  Family History  Problem Relation Age of Onset   Breast cancer Mother    Lung cancer Father    Liver cancer Father    Arthritis Sister    Depression Sister    Hyperlipidemia Sister    Hypertension Sister    Alcohol abuse Brother    Arthritis Brother    Depression Brother    Hyperlipidemia Brother    Hypertension Brother    Cancer Daughter    COPD Daughter    Depression Son    Arthritis Maternal Grandmother    Depression Maternal Grandmother    Hyperlipidemia Maternal Grandmother    Hypertension Maternal Grandmother    Stroke Maternal Grandmother    Alcohol abuse Maternal Grandfather    Arthritis Maternal Grandfather    Cancer Maternal Grandfather    Hyperlipidemia Maternal Grandfather    Hypertension Maternal Grandfather    Stroke Maternal Grandfather    Arthritis Paternal Grandmother  Hyperlipidemia Paternal Grandmother    Hypertension Paternal Grandmother    Arthritis Paternal Grandfather    Alcohol abuse Paternal Grandfather    Diabetes Paternal Grandfather    Hyperlipidemia Paternal Grandfather    Heart disease Paternal Grandfather    Hearing loss Paternal Grandfather    Stroke Paternal Grandfather    Colon polyps Neg Hx    Esophageal cancer Neg Hx    Rectal cancer Neg Hx    Stomach cancer Neg Hx      Current Outpatient Medications:    Accu-Chek Softclix Lancets lancets, Use once daily Dx E11.9, Disp: 100 each, Rfl: 0   aspirin 81 MG chewable tablet, Chew by mouth daily., Disp: , Rfl:    Aspirin Buf,CaCarb-MgCarb-MgO, 81 MG TABS, Take by mouth., Disp: , Rfl:     atenolol (TENORMIN) 50 MG tablet, TAKE 1 TABLET BY MOUTH EVERY DAY, Disp: 90 tablet, Rfl: 1   atorvastatin (LIPITOR) 20 MG tablet, TAKE 1 TABLET BY MOUTH EVERY DAY, Disp: 90 tablet, Rfl: 1   Blood Glucose Monitoring Suppl (ACCU-CHEK GUIDE ME) w/Device KIT, Use daily for glucose control . Dx E11.9, Disp: 1 kit, Rfl: 0   Calcium Carbonate-Vitamin D (CALCIUM-D PO), Take by mouth. Caltrate plus D, Disp: , Rfl:    Cyanocobalamin (VITAMIN B 12 PO), Take by mouth., Disp: , Rfl:    erythromycin ophthalmic ointment, Place one application into both eyes every 6 (six) hours for 5 days., Disp: , Rfl:    esomeprazole (NEXIUM) 20 MG capsule, Take 20 mg by mouth daily at 12 noon., Disp: , Rfl:    glipiZIDE (GLUCOTROL) 5 MG tablet, Take 1 tablet (5 mg total) by mouth daily before breakfast., Disp: 90 tablet, Rfl: 3   glucose blood (ACCU-CHEK GUIDE) test strip, Test once daily for glucose control.  Dx E11.9, Disp: 100 each, Rfl: 12   hydrochlorothiazide (HYDRODIURIL) 25 MG tablet, Take 1 tablet (25 mg total) by mouth daily., Disp: 90 tablet, Rfl: 1   Multiple Vitamin (MULTIVITAMIN) capsule, Take by mouth., Disp: , Rfl:    Semaglutide,0.25 or 0.5MG /DOS, (OZEMPIC, 0.25 OR 0.5 MG/DOSE,) 2 MG/1.5ML SOPN, Inject 0.5 mg into the skin once a week., Disp: 4.5 mL, Rfl: 3   traZODone (DESYREL) 150 MG tablet, TAKE 1/2 TABLET IN THE EVENING, Disp: 45 tablet, Rfl: 1   vitamin E 1000 UNIT capsule, Take 1,000 Units by mouth daily., Disp: , Rfl:    XIIDRA 5 % SOLN, , Disp: , Rfl:    metFORMIN (GLUCOPHAGE) 500 MG tablet, TAKE 2 TABLETS (1,000 MG TOTAL) BY MOUTH 2 (TWO) TIMES DAILY WITH A MEAL., Disp: 360 tablet, Rfl: 1  Review of Systems:  Constitutional: Denies fever, chills, diaphoresis, appetite change and fatigue.  HEENT: Denies photophobia, eye pain, redness, hearing loss, ear pain, congestion, sore throat, rhinorrhea, sneezing, mouth sores, trouble swallowing, neck pain, neck stiffness and tinnitus.   Respiratory: Denies  SOB, DOE, cough, chest tightness,  and wheezing.   Cardiovascular: Denies chest pain, palpitations and leg swelling.  Gastrointestinal: Denies nausea, vomiting, abdominal pain, diarrhea, constipation, blood in stool and abdominal distention.  Genitourinary: Denies dysuria, urgency, frequency, hematuria, flank pain and difficulty urinating.  Endocrine: Denies: hot or cold intolerance, sweats, changes in hair or nails, polyuria, polydipsia. Musculoskeletal: Denies myalgias, back pain, joint swelling, arthralgias and gait problem.  Skin: Denies pallor, rash and wound.  Neurological: Denies dizziness, seizures, syncope, weakness, light-headedness, numbness and headaches.  Hematological: Denies adenopathy. Easy bruising, personal or family bleeding history  Psychiatric/Behavioral: Denies suicidal  ideation, mood changes, confusion, nervousness, sleep disturbance and agitation    Physical Exam: Vitals:   05/03/21 0755  BP: 120/80  Pulse: 71  Temp: 98 F (36.7 C)  TempSrc: Oral  SpO2: 97%  Weight: 227 lb 6.4 oz (103.1 kg)  Height: 5\' 2"  (1.575 m)    Body mass index is 41.59 kg/m.   Constitutional: NAD, calm, comfortable, obese Eyes: PERRL, lids and conjunctivae normal, wears corrective lenses ENMT: Mucous membranes are moist. Posterior pharynx clear of any exudate or lesions. Normal dentition. Tympanic membrane is pearly white, no erythema or bulging. Neck: normal, supple, no masses, no thyromegaly Respiratory: clear to auscultation bilaterally, no wheezing, no crackles. Normal respiratory effort. No accessory muscle use.  Cardiovascular: Regular rate and rhythm, no murmurs / rubs / gallops. No extremity edema. 2+ pedal pulses. No carotid bruits.  Abdomen: no tenderness, no masses palpated. No hepatosplenomegaly. Bowel sounds positive.  Musculoskeletal: no clubbing / cyanosis. No joint deformity upper and lower extremities. Good ROM, no contractures. Normal muscle tone.  Skin: no  rashes, lesions, ulcers. No induration Neurologic: CN 2-12 grossly intact. Sensation intact, DTR normal. Strength 5/5 in all 4.  Psychiatric: Normal judgment and insight. Alert and oriented x 3. Normal mood.    Subsequent Medicare wellness visit   1. Risk factors, based on past  M,S,F -cardiovascular disease risk factors include age, history of diabetes, history of hyperlipidemia, history of hypertension, obesity   2.  Physical activities: Very sedentary   3.  Depression/mood: History of depression but mood is stable   4.  Hearing: She has bilateral hearing loss with recent hearing aids   5.  ADL's: Independent in all ADLs   6.  Fall risk: Low fall risk   7.  Home safety: No problems identified   8.  Height weight, and visual acuity: height and weight as above, vision:  Vision Screening   Right eye Left eye Both eyes  Without correction     With correction 20/25 20/32 20/25      9.  Counseling: Advise she update her vaccination status, we have also discussed healthy lifestyle changes   10. Lab orders based on risk factors: Laboratory update will be reviewed   11. Referral : None today   12. Care plan: Follow-up with me in 6 months   13. Cognitive assessment: No cognitive impairment   14. Screening: Patient provided with a written and personalized 5-10 year screening schedule in the AVS. yes   15. Provider List Update: PCP, endocrinology, ophthalmology  16. Advance Directives: Full code   17. Opioids: Patient is not on any opioid prescriptions and has no risk factors for a substance use disorder.   Flowsheet Row Office Visit from 05/03/2021 in [facility]  PHQ-9 Total Score 1       Fall Risk  05/03/2021 04/14/2020  Falls in the past year? 0 0  Number falls in past yr: 0 0  Injury with Fall? 0 0     Impression and Plan:  Encounter for preventive health examination -She has routine eye and dental care. -She is due for Tdap, shingles,  fourth COVID and pneumonia vaccine.  She declines pneumonia but agrees to update all the others at her pharmacy. -Healthy lifestyle discussed in detail. -No need for labs today as they have been done recently. -She had a DEXA scan in 21, redo in 23. -She is overdue for repeat colonoscopy for high risk surveillance, sent back to GI. -She is scheduled for  her mammogram next week. -No longer does cervical cancer screening due to age, I agree.  Primary hypertension -Well-controlled.  Uncontrolled type 2 diabetes mellitus with hyperglycemia ([place]) -With improved control now, followed by endocrinology.  Hyperlipidemia associated with type 2 diabetes mellitus ([place]) -Last lipid panel in March 2022 with total cholesterol 121, triglycerides 135 and LDL 42. -She is on daily atorvastatin.  Morbid obesity ([place]) -Discussed healthy lifestyle, including increased physical activity and better food choices to promote weight loss.  Adenomatous polyp of colon, unspecified part of colon -High risk surveillance, due for repeat colonoscopy.  Send back to GI.  Depression, recurrent ([facility]) -Mood is stable.    Patient Instructions  -Nice seeing you today!!  -Remember the following vaccines: tetanus, shingles, 4th COVID and pneumonia.  -Schedule follow up in 6 months.     Lelon Frohlich, MD  Primary Care at [facility]

## 2021-05-03 NOTE — Patient Instructions (Signed)
-  Nice seeing you today!!  -Remember the following vaccines: tetanus, shingles, 4th COVID and pneumonia.  -Schedule follow up in 6 months.

## 2021-05-16 ENCOUNTER — Other Ambulatory Visit: Payer: Self-pay

## 2021-05-16 ENCOUNTER — Ambulatory Visit
Admission: RE | Admit: 2021-05-16 | Discharge: 2021-05-16 | Disposition: A | Discharge status: Home / Self Care | Payer: Medicare PPO | Source: Ambulatory Visit | Attending: Internal Medicine | Admitting: Internal Medicine

## 2021-05-16 DIAGNOSIS — Z1231 Encounter for screening mammogram for malignant neoplasm of breast: Secondary | ICD-10-CM | POA: Diagnosis not present

## 2021-05-22 ENCOUNTER — Other Ambulatory Visit: Payer: Self-pay | Admitting: Internal Medicine

## 2021-05-25 DIAGNOSIS — L814 Other melanin hyperpigmentation: Secondary | ICD-10-CM | POA: Diagnosis not present

## 2021-05-25 DIAGNOSIS — L570 Actinic keratosis: Secondary | ICD-10-CM | POA: Diagnosis not present

## 2021-05-25 DIAGNOSIS — Z85828 Personal history of other malignant neoplasm of skin: Secondary | ICD-10-CM | POA: Diagnosis not present

## 2021-05-25 DIAGNOSIS — D0462 Carcinoma in situ of skin of left upper limb, including shoulder: Secondary | ICD-10-CM | POA: Diagnosis not present

## 2021-05-25 DIAGNOSIS — D225 Melanocytic nevi of trunk: Secondary | ICD-10-CM | POA: Diagnosis not present

## 2021-05-25 DIAGNOSIS — L821 Other seborrheic keratosis: Secondary | ICD-10-CM | POA: Diagnosis not present

## 2021-06-06 ENCOUNTER — Other Ambulatory Visit: Payer: Self-pay | Admitting: Internal Medicine

## 2021-06-06 DIAGNOSIS — I10 Essential (primary) hypertension: Secondary | ICD-10-CM

## 2021-06-15 ENCOUNTER — Encounter: Payer: Self-pay | Admitting: Internal Medicine

## 2021-06-15 ENCOUNTER — Ambulatory Visit (INDEPENDENT_AMBULATORY_CARE_PROVIDER_SITE_OTHER): Payer: Medicare PPO | Admitting: Internal Medicine

## 2021-06-15 ENCOUNTER — Other Ambulatory Visit: Payer: Self-pay

## 2021-06-15 VITALS — BP 134/82 | HR 72 | Ht 64.0 in | Wt 230.0 lb

## 2021-06-15 DIAGNOSIS — E119 Type 2 diabetes mellitus without complications: Secondary | ICD-10-CM

## 2021-06-15 DIAGNOSIS — E1165 Type 2 diabetes mellitus with hyperglycemia: Secondary | ICD-10-CM

## 2021-06-15 LAB — POCT GLYCOSYLATED HEMOGLOBIN (HGB A1C): Hemoglobin A1C: 6.7 % — AB (ref 4.0–5.6)

## 2021-06-15 MED ORDER — GLIPIZIDE 5 MG PO TABS: 5.0000 mg | ORAL_TABLET | Freq: Every day | ORAL | 3 refills | Status: DC

## 2021-06-15 MED ORDER — OZEMPIC (0.25 OR 0.5 MG/DOSE) 2 MG/1.5ML ~~LOC~~ SOPN: 0.5000 mg | PEN_INJECTOR | SUBCUTANEOUS | 3 refills | Status: DC

## 2021-06-15 MED ORDER — METFORMIN HCL 500 MG PO TABS: 1000.0000 mg | ORAL_TABLET | Freq: Two times a day (BID) | ORAL | 3 refills | Status: DC

## 2021-06-15 NOTE — Progress Notes (Signed)
Name: Diane Decker  Age/ Sex: 75 y.o., female   MRN/ DOB: [id number], [birth year]     PCP: Isaac Bliss, Rayford Halsted, MD   Reason for Endocrinology Evaluation: Type 2 Diabetes Mellitus  Initial Endocrine Consultative Visit: 11/10/2020    PATIENT IDENTIFIER: Diane Decker is a 75 y.o. female with a past medical history of T2DM, barrett's esophagus. The patient has followed with Endocrinology clinic since 11/10/2020 for consultative assistance with management of her diabetes.  DIABETIC HISTORY:  [name] was diagnosed with DM at age 64, ozempic caused GI side effects. . Her hemoglobin A1c has ranged from 6.6%  in 2021, peaking at 9.7 %in 2021.    On her initial visit to our clinic her A1c was 6.6 % she was on Glipizide XL, Metformin and wanted to give ozempic another try. We swiched XL glipizide to regular release     SUBJECTIVE:   During the last visit (02/09/2021): A1c 6.7% Increased Ozempic, continued Glipizide and Metformin      Today (06/15/2021): [name]  is here for a follow up on diabetes management.  She checks her blood sugars occasionally.The patient has not  had hypoglycemic episodes since the last clinic visit.   Denies nausea or diarrhea   She has been noted with weight increase    HOME DIABETES REGIMEN:  Glipizide 5 mg, 1 tablet before Breakfast  Metformin 500 mg , 2 tablets with Breakfast and 2 tablet with supper  Ozempic 0.5 mg once weekly     Statin: yes  ACE-I/ARB: Intolerant to lisinopril and losartan    METER DOWNLOAD SUMMARY: 8/11-8/25/2022 Average Number Tests/Day = 0.2 Overall Mean FS Glucose = 143 Standard Deviation = 8  BG Ranges: Low = 134 High = 149   Hypoglycemic Events/30 Days: BG < 50 = 0 Episodes of symptomatic severe hypoglycemia = 0   DIABETIC COMPLICATIONS: Microvascular complications:   Denies: CKD, retinopathy, neuropathy  Last Eye Exam: Completed 2021  Macrovascular  complications:   Denies: CAD, CVA, PVD   HISTORY:  Past Medical History:  Past Medical History:  Diagnosis Date   Allergy    Arthritis    Barrett's esophagus    Cancer ([place])    skin cancer   Chicken pox    Depression    Diabetes mellitus without complication ([place])    Family history of polyps in the colon    GERD (gastroesophageal reflux disease)    Hypertension    Osteopenia    Sleep apnea    borderline- no cpap use    Past Surgical History:  Past Surgical History:  Procedure Laterality Date   ABDOMINAL HYSTERECTOMY     BREAST BIOPSY  2007   COLONOSCOPY     KNEE ARTHROSCOPY Left    ~15 yrs ago    POLYPECTOMY     TONSILLECTOMY  1949   UPPER GASTROINTESTINAL ENDOSCOPY     Social History:  reports that she has quit smoking. She has never used smokeless tobacco. She reports that she does not drink alcohol and does not use drugs. Family History:  Family History  Problem Relation Age of Onset   Breast cancer Mother    Lung cancer Father    Liver cancer Father    Arthritis Sister    Depression Sister    Hyperlipidemia Sister    Hypertension Sister    Alcohol abuse Brother    Arthritis Brother    Depression Brother    Hyperlipidemia Brother    Hypertension Brother  Cancer Daughter    COPD Daughter    Depression Son    Arthritis Maternal Grandmother    Depression Maternal Grandmother    Hyperlipidemia Maternal Grandmother    Hypertension Maternal Grandmother    Stroke Maternal Grandmother    Alcohol abuse Maternal Grandfather    Arthritis Maternal Grandfather    Cancer Maternal Grandfather    Hyperlipidemia Maternal Grandfather    Hypertension Maternal Grandfather    Stroke Maternal Grandfather    Arthritis Paternal Grandmother    Hyperlipidemia Paternal Grandmother    Hypertension Paternal Grandmother    Arthritis Paternal Grandfather    Alcohol abuse Paternal Grandfather    Diabetes Paternal Grandfather    Hyperlipidemia Paternal Grandfather     Heart disease Paternal Grandfather    Hearing loss Paternal Grandfather    Stroke Paternal Grandfather    Colon polyps Neg Hx    Esophageal cancer Neg Hx    Rectal cancer Neg Hx    Stomach cancer Neg Hx      HOME MEDICATIONS: Allergies as of 06/15/2021       Reactions   Penicillins Anaphylaxis   Whey Protein [protein] Shortness Of Breath   Lisinopril Other (See Comments), Swelling   Losartan Potassium Swelling   Losartan Nausea And Vomiting        Medication List        Accurate as of June 15, 2021  9:35 AM. If you have any questions, ask your nurse or doctor.          Accu-Chek Guide Me w/Device Kit Use daily for glucose control . Dx E11.9   Accu-Chek Guide test strip Generic drug: glucose blood Test once daily for glucose control.  Dx E11.9   Accu-Chek Softclix Lancets lancets Use once daily Dx E11.9   aspirin 81 MG chewable tablet Chew by mouth daily.   Aspirin Buf(CaCarb-MgCarb-MgO) 81 MG Tabs Take by mouth.   atenolol 50 MG tablet Commonly known as: TENORMIN TAKE 1 TABLET BY MOUTH EVERY DAY   atorvastatin 20 MG tablet Commonly known as: LIPITOR TAKE 1 TABLET BY MOUTH EVERY DAY   CALCIUM-D PO Take by mouth. Caltrate plus D   erythromycin ophthalmic ointment Place one application into both eyes every 6 (six) hours for 5 days.   esomeprazole 20 MG capsule Commonly known as: NEXIUM Take 20 mg by mouth daily at 12 noon.   glipiZIDE 5 MG tablet Commonly known as: GLUCOTROL Take 1 tablet (5 mg total) by mouth daily before breakfast.   hydrochlorothiazide 25 MG tablet Commonly known as: HYDRODIURIL TAKE 1 TABLET (25 MG TOTAL) BY MOUTH DAILY.   metFORMIN 500 MG tablet Commonly known as: GLUCOPHAGE TAKE 2 TABLETS (1,000 MG TOTAL) BY MOUTH 2 (TWO) TIMES DAILY WITH A MEAL.   multivitamin capsule Take by mouth.   Ozempic (0.25 or 0.5 MG/DOSE) 2 MG/1.5ML Sopn Generic drug: Semaglutide(0.25 or 0.5MG/DOS) Inject 0.5 mg into the skin once a  week.   traZODone 150 MG tablet Commonly known as: DESYREL TAKE 1/2 TABLET IN THE EVENING   VITAMIN B 12 PO Take by mouth.   vitamin E 1000 UNIT capsule Take 1,000 Units by mouth daily.   Xiidra 5 % Soln Generic drug: Lifitegrast         OBJECTIVE:   Vital Signs: BP 134/82 (BP Location: Left Arm, Patient Position: Sitting, Cuff Size: Large)   Pulse 72   Ht _0  (1.626 m)   Wt 230 lb (104.3 kg)   SpO2 99%  BMI 39.48 kg/m   Wt Readings from Last 3 Encounters:  06/15/21 230 lb (104.3 kg)  05/03/21 227 lb 6.4 oz (103.1 kg)  02/09/21 224 lb 8 oz (101.8 kg)     Exam: General: Pt appears well and is in NAD  Lungs: Clear with good BS bilat   Heart: RRR   Extremities: No pretibial edema.   Neuro: MS is good with appropriate affect, pt is alert and Ox3   DM foot exam: 11/10/2020   The skin of the feet is intact without sores or ulcerations. The pedal pulses are 2+ on right and 2+ on left. The sensation is intact to a screening 5.07, 10 gram monofilament bilaterally     DATA REVIEWED:  Lab Results  Component Value Date   HGBA1C 6.7 (A) 02/09/2021   HGBA1C 6.1 11/08/2020   HGBA1C 6.6 (A) 09/09/2020   Lab Results  Component Value Date   MICROALBUR 1.7 02/09/2021   LDLCALC 42 12/20/2020   CREATININE 0.92 12/20/2020    Lab Results  Component Value Date   CHOL 121 12/20/2020   HDL 52.10 12/20/2020   LDLCALC 42 12/20/2020   TRIG 135.0 12/20/2020   CHOLHDL 2 12/20/2020         ASSESSMENT / PLAN / RECOMMENDATIONS:   1) Type 2 Diabetes Mellitus, Optimally controlled, Without complications - Most recent A1c of 6.7  %. Goal A1c < 7.0 %.    -Praised the pt on continued optimization of glucose control  - No changes today    MEDICATIONS: Continue glipizide 5 mg, 1 tablet before breakfast Continue metformin 500 mg, 2 tablets twice daily Continue  Ozempic  0.5 mg weekly  EDUCATION / INSTRUCTIONS: BG monitoring instructions: Patient is instructed to  check her blood sugars 1 times a day, fsting Call [facility] if: BG persistently < 70  I reviewed the Rule of 15 for the treatment of hypoglycemia in detail with the patient. Literature supplied.   2) Diabetic complications:  Eye: Does not have known diabetic retinopathy.  Neuro/ Feet: Does not have known diabetic peripheral neuropathy .  Renal: Patient does not have known baseline CKD. She   is intolerant to  ACEI/ARB    3) Dyslipidemia :  - LDL at goal of 42 mg/dL    Medication  Continue Atorvastatin 20 mg daily      F/U in 6 months   Signed electronically by: Mack Guise, MD  [facility]  [facility] [address] [place], [place] [address] Phone: [phone] FAX: [phone]   CC: Isaac Bliss, Rayford Halsted, MD [place] [address] Phone: [phone]  Fax: [phone]  Return to Endocrinology clinic as below: Future Appointments  Date Time Provider [place]  11/03/2021  9:00 AM Isaac Bliss, Rayford Halsted, MD [id number]

## 2021-06-15 NOTE — Patient Instructions (Addendum)
-   Keep Up the Good Work !  - Continue  Glipizide 5 mg, 1 tablet before Breakfast  - Continue Metformin 500 mg , 2 tablets with Breakfast and 2 tablet with supper  - Continue  Ozempic 0.5 mg once weekly    HOW TO TREAT LOW BLOOD SUGARS (Blood sugar LESS THAN 70 MG/DL) Please follow the RULE OF 15 for the treatment of hypoglycemia treatment (when your (blood sugars are less than 70 mg/dL)   STEP 1: Take 15 grams of carbohydrates when your blood sugar is low, which includes:  3-4 GLUCOSE TABS  OR 3-4 OZ OF JUICE OR REGULAR SODA OR ONE TUBE OF GLUCOSE GEL    STEP 2: RECHECK blood sugar in 15 MINUTES STEP 3: If your blood sugar is still low at the 15 minute recheck --> then, go back to STEP 1 and treat AGAIN with another 15 grams of carbohydrates.

## 2021-07-06 ENCOUNTER — Encounter: Payer: Self-pay | Admitting: Gastroenterology

## 2021-08-30 ENCOUNTER — Other Ambulatory Visit: Payer: Self-pay | Admitting: Internal Medicine

## 2021-09-29 ENCOUNTER — Other Ambulatory Visit: Payer: Self-pay | Admitting: Internal Medicine

## 2021-10-27 ENCOUNTER — Other Ambulatory Visit: Payer: Self-pay | Admitting: Internal Medicine

## 2021-11-02 DIAGNOSIS — H5203 Hypermetropia, bilateral: Secondary | ICD-10-CM | POA: Diagnosis not present

## 2021-11-02 DIAGNOSIS — H25813 Combined forms of age-related cataract, bilateral: Secondary | ICD-10-CM | POA: Diagnosis not present

## 2021-11-02 DIAGNOSIS — E113291 Type 2 diabetes mellitus with mild nonproliferative diabetic retinopathy without macular edema, right eye: Secondary | ICD-10-CM | POA: Diagnosis not present

## 2021-11-02 DIAGNOSIS — H16223 Keratoconjunctivitis sicca, not specified as Sjogren's, bilateral: Secondary | ICD-10-CM | POA: Diagnosis not present

## 2021-11-02 LAB — HM DIABETES EYE EXAM

## 2021-11-03 ENCOUNTER — Ambulatory Visit: Payer: Medicare PPO | Admitting: Internal Medicine

## 2021-11-06 ENCOUNTER — Ambulatory Visit: Payer: Medicare PPO | Admitting: Internal Medicine

## 2021-11-06 ENCOUNTER — Encounter: Payer: Self-pay | Admitting: Internal Medicine

## 2021-11-06 VITALS — BP 122/80 | HR 72 | Temp 97.7°F | Wt 219.0 lb

## 2021-11-06 DIAGNOSIS — D126 Benign neoplasm of colon, unspecified: Secondary | ICD-10-CM

## 2021-11-06 DIAGNOSIS — Z794 Long term (current) use of insulin: Secondary | ICD-10-CM | POA: Diagnosis not present

## 2021-11-06 DIAGNOSIS — I10 Essential (primary) hypertension: Secondary | ICD-10-CM | POA: Diagnosis not present

## 2021-11-06 DIAGNOSIS — E1169 Type 2 diabetes mellitus with other specified complication: Secondary | ICD-10-CM | POA: Diagnosis not present

## 2021-11-06 DIAGNOSIS — E785 Hyperlipidemia, unspecified: Secondary | ICD-10-CM

## 2021-11-06 DIAGNOSIS — E119 Type 2 diabetes mellitus without complications: Secondary | ICD-10-CM | POA: Diagnosis not present

## 2021-11-06 LAB — POCT GLYCOSYLATED HEMOGLOBIN (HGB A1C): HbA1c, POC (controlled diabetic range): 6.9 % (ref 0.0–7.0)

## 2021-11-06 NOTE — Progress Notes (Signed)
Established Patient Office Visit     This visit occurred during the SARS-CoV-2 public health emergency.  Safety protocols were in place, including screening questions prior to the visit, additional usage of staff PPE, and extensive cleaning of exam room while observing appropriate contact time as indicated for disinfecting solutions.    CC/Reason for Visit: 66-monthfollow-up chronic medical conditions  HPI: Diane Decker a 76y.o. female who is coming in today for the above mentioned reasons. Past Medical History is significant for: Hypertension, hyperlipidemia, morbid obesity, GERD, Barrett's esophagus, type 2 diabetes followed by endocrinology.  She has been doing well and has no acute concerns.  She does state that she has noticed her CBGs have been higher.  She has been eating a lot more fruit.  She declines flu vaccine despite counseling.   Past Medical/Surgical History: Past Medical History:  Diagnosis Date   Allergy    Arthritis    Barrett's esophagus    [place]    [place]   Chicken pox    Depression    [place]    Family history of polyps in the colon    GERD (gastroesophageal reflux disease)    Hypertension    Osteopenia    Sleep apnea    borderline- no cpap use     Past Surgical History:  Procedure Laterality Date   ABDOMINAL HYSTERECTOMY     BREAST BIOPSY  2007   COLONOSCOPY     KNEE ARTHROSCOPY Left    ~15 yrs ago    POLYPECTOMY     TONSILLECTOMY  1949   UPPER GASTROINTESTINAL ENDOSCOPY      Social History:  reports that she has quit smoking. She has never used smokeless tobacco. She reports that she does not drink alcohol and does not use drugs.  Allergies: Allergies  Allergen Reactions   Penicillins Anaphylaxis   Whey Protein [Protein] Shortness Of Breath   Lisinopril Other (See Comments) and Swelling   Losartan Potassium Swelling   Losartan Nausea And Vomiting    Family  History:  Family History  Problem Relation Age of Onset   Breast cancer Mother    Lung cancer Father    Liver cancer Father    Arthritis Sister    Depression Sister    Hyperlipidemia Sister    Hypertension Sister    Alcohol abuse Brother    Arthritis Brother    Depression Brother    Hyperlipidemia Brother    Hypertension Brother    Cancer Daughter    COPD Daughter    Depression Son    Arthritis Maternal Grandmother    Depression Maternal Grandmother    Hyperlipidemia Maternal Grandmother    Hypertension Maternal Grandmother    Stroke Maternal Grandmother    Alcohol abuse Maternal Grandfather    Arthritis Maternal Grandfather    Cancer Maternal Grandfather    Hyperlipidemia Maternal Grandfather    Hypertension Maternal Grandfather    Stroke Maternal Grandfather    Arthritis Paternal Grandmother    Hyperlipidemia Paternal Grandmother    Hypertension Paternal Grandmother    Arthritis Paternal Grandfather    Alcohol abuse Paternal Grandfather    Diabetes Paternal Grandfather    Hyperlipidemia Paternal Grandfather    Heart disease Paternal Grandfather    Hearing loss Paternal Grandfather    Stroke Paternal Grandfather    Colon polyps Neg Hx    Esophageal cancer Neg Hx    Rectal cancer Neg Hx  Stomach cancer Neg Hx      Current Outpatient Medications:    Accu-Chek Softclix Lancets lancets, Use once daily Dx E11.9, Disp: 100 each, Rfl: 0   aspirin 81 MG chewable tablet, Chew by mouth daily., Disp: , Rfl:    Aspirin Buf,CaCarb-MgCarb-MgO, 81 MG TABS, Take by mouth., Disp: , Rfl:    atenolol (TENORMIN) 50 MG tablet, TAKE 1 TABLET BY MOUTH EVERY DAY, Disp: 90 tablet, Rfl: 1   atorvastatin (LIPITOR) 20 MG tablet, TAKE 1 TABLET BY MOUTH EVERY DAY, Disp: 90 tablet, Rfl: 1   Blood Glucose Monitoring Suppl (ACCU-CHEK GUIDE ME) w/Device KIT, Use daily for glucose control . Dx E11.9, Disp: 1 kit, Rfl: 0   Calcium Carbonate-Vitamin D (CALCIUM-D PO), Take by mouth. Caltrate plus  D, Disp: , Rfl:    cycloSPORINE (RESTASIS) 0.05 % ophthalmic emulsion, 1 drop 2 (two) times daily., Disp: , Rfl:    esomeprazole (NEXIUM) 20 MG capsule, Take 20 mg by mouth daily at 12 noon., Disp: , Rfl:    glipiZIDE (GLUCOTROL) 5 MG tablet, Take 1 tablet (5 mg total) by mouth daily before breakfast., Disp: 90 tablet, Rfl: 3   glucose blood (ACCU-CHEK GUIDE) test strip, Test once daily for glucose control.  Dx E11.9, Disp: 100 each, Rfl: 12   hydrochlorothiazide (HYDRODIURIL) 25 MG tablet, TAKE 1 TABLET (25 MG TOTAL) BY MOUTH DAILY., Disp: 90 tablet, Rfl: 1   metFORMIN (GLUCOPHAGE) 500 MG tablet, Take 2 tablets (1,000 mg total) by mouth 2 (two) times daily with a meal., Disp: 360 tablet, Rfl: 3   Multiple Vitamin (MULTIVITAMIN) capsule, Take by mouth., Disp: , Rfl:    Semaglutide,0.25 or 0.5MG/DOS, (OZEMPIC, 0.25 OR 0.5 MG/DOSE,) 2 MG/1.5ML SOPN, Inject 0.5 mg into the skin once a week., Disp: 4.5 mL, Rfl: 3   traZODone (DESYREL) 150 MG tablet, TAKE 1/2 TABLET IN THE EVENING, Disp: 45 tablet, Rfl: 1   vitamin E 1000 UNIT capsule, Take 1,000 Units by mouth daily., Disp: , Rfl:    erythromycin ophthalmic ointment, Place one application into both eyes every 6 (six) hours for 5 days. (Patient not taking: Reported on 11/06/2021), Disp: , Rfl:   Review of Systems:  Constitutional: Denies fever, chills, diaphoresis, appetite change and fatigue.  HEENT: Denies photophobia, eye pain, redness, hearing loss, ear pain, congestion, sore throat, rhinorrhea, sneezing, mouth sores, trouble swallowing, neck pain, neck stiffness and tinnitus.   Respiratory: Denies SOB, DOE, cough, chest tightness,  and wheezing.   Cardiovascular: Denies chest pain, palpitations and leg swelling.  Gastrointestinal: Denies nausea, vomiting, abdominal pain, diarrhea, constipation, blood in stool and abdominal distention.  Genitourinary: Denies dysuria, urgency, frequency, hematuria, flank pain and difficulty urinating.  Endocrine:  Denies: hot or cold intolerance, sweats, changes in hair or nails, polyuria, polydipsia. Musculoskeletal: Denies myalgias, back pain, joint swelling, arthralgias and gait problem.  Skin: Denies pallor, rash and wound.  Neurological: Denies dizziness, seizures, syncope, weakness, light-headedness, numbness and headaches.  Hematological: Denies adenopathy. Easy bruising, personal or family bleeding history  Psychiatric/Behavioral: Denies suicidal ideation, mood changes, confusion, nervousness, sleep disturbance and agitation    Physical Exam: Vitals:   11/06/21 1517  BP: 122/80  Pulse: 72  Temp: 97.7 F (36.5 C)  TempSrc: Oral  SpO2: 95%  Weight: 219 lb (99.3 kg)    Body mass index is 37.59 kg/m.   Constitutional: NAD, calm, comfortable, obese Eyes: PERRL, lids and conjunctivae normal, wears corrective lenses ENMT: Mucous membranes are moist.  Respiratory: clear to auscultation bilaterally,  no wheezing, no crackles. Normal respiratory effort. No accessory muscle use.  °Cardiovascular: Regular rate and rhythm, no murmurs / rubs / gallops. No extremity edema.  °Neurologic: Grossly intact and nonfocal °Psychiatric: Normal judgment and insight. Alert and oriented x 3. Normal mood.  ° ° °Impression and Plan: ° °Type 2 diabetes mellitus without complication, with long-term current use of insulin (HCC) ° - Plan: POC HgB A1c °-A1c is 6.9 in office today. ° °Adenomatous polyp of colon, unspecified part of colon °-Last colonoscopy in 2021, follows with GI as scheduled. ° °Primary hypertension °-Blood pressures well controlled. ° °Hyperlipidemia associated with type 2 diabetes mellitus (HCC) °-Last LDL was at goal at 42 in March 2022, she is on atorvastatin. ° °Morbid obesity (HCC) °-Discussed healthy lifestyle, including increased physical activity and better food choices to promote weight loss. ° ° °Time spent: 30 minutes reviewing chart, interviewing and examining patient and formulating plan of  care. ° ° ° °Estela Hernandez Acosta, MD °[facility] ° ° °

## 2021-11-06 NOTE — Patient Instructions (Signed)
Health Maintenance Due  Topic Date Due   Pneumonia Vaccine 7+ Years old (1 - PCV) Never done   FOOT EXAM  Never done   Hepatitis C Screening  Never done   Zoster Vaccines- Shingrix (1 of 2) Never done   INFLUENZA VACCINE  05/22/2021   COVID-19 Vaccine (5 - Booster for Moderna series) 07/18/2021   OPHTHALMOLOGY EXAM  09/29/2021    Depression screen PHQ 2/9 05/03/2021 07/14/2020 01/06/2020  Decreased Interest 0 0 0  Down, Depressed, Hopeless 0 0 0  PHQ - 2 Score 0 0 0  Altered sleeping 0 0 0  Tired, decreased energy 1 0 0  Change in appetite 0 0 0  Feeling bad or failure about yourself  0 0 0  Trouble concentrating 0 0 0  Moving slowly or fidgety/restless 0 0 0  Suicidal thoughts 0 0 0  PHQ-9 Score 1 0 0  Difficult doing work/chores Not difficult at all Not difficult at all Not difficult at all

## 2021-11-09 ENCOUNTER — Encounter: Payer: Self-pay | Admitting: Internal Medicine

## 2021-11-09 DIAGNOSIS — H25813 Combined forms of age-related cataract, bilateral: Secondary | ICD-10-CM | POA: Diagnosis not present

## 2021-11-13 ENCOUNTER — Other Ambulatory Visit: Payer: Self-pay

## 2021-11-13 ENCOUNTER — Encounter: Payer: Self-pay | Admitting: Family Medicine

## 2021-11-13 ENCOUNTER — Ambulatory Visit (INDEPENDENT_AMBULATORY_CARE_PROVIDER_SITE_OTHER): Payer: Medicare PPO

## 2021-11-13 ENCOUNTER — Ambulatory Visit: Payer: Medicare PPO | Admitting: Family Medicine

## 2021-11-13 VITALS — BP 142/80 | HR 70 | Temp 97.0°F | Ht 62.0 in | Wt 228.0 lb

## 2021-11-13 DIAGNOSIS — M79605 Pain in left leg: Secondary | ICD-10-CM
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
## 2021-11-14 ENCOUNTER — Encounter: Payer: Self-pay | Admitting: Gastroenterology

## 2021-11-14 ENCOUNTER — Ambulatory Visit: Payer: Medicare PPO | Admitting: Gastroenterology

## 2021-11-14 VITALS — BP 130/68 | HR 76 | Ht 64.0 in | Wt 227.0 lb

## 2021-11-14 DIAGNOSIS — K219 Gastro-esophageal reflux disease without esophagitis: Secondary | ICD-10-CM

## 2021-11-14 DIAGNOSIS — Z86010 Personal history of colonic polyps: Secondary | ICD-10-CM

## 2021-11-14 DIAGNOSIS — Z79899 Other long term (current) drug therapy: Secondary | ICD-10-CM

## 2021-11-14 MED ORDER — SUTAB 1479-225-188 MG PO TABS: 1.0000 | ORAL_TABLET | Freq: Once | ORAL | 0 refills | Status: AC

## 2021-11-14 NOTE — Progress Notes (Signed)
HPI :  76 year old female here for follow-up visit, accompanied by her daughter, for history of reflux and history of colon polyps.  I last saw her for an endoscopy and colonoscopy in May 2021 as outlined below.  The EGD was to evaluate reported history of Barrett's esophagus.  She had a slightly irregular Z-line with some small islands of salmon-colored mucosa but biopsies showed no evidence of Barrett's esophagus.  Her colonoscopy was remarkable for 18 adenomas, all small less than 1 cm in size.  In speaking with her she has had multiple colonoscopies over the years, all of them have shown polyps in the past.  Her last exam prior to this most recent 1 was in June 2018 at which point time she had multiple polyps removed.  She denies any family history of colon cancer.  Her bowels are generally regular if she watches her diet.  If she eats specific trigger foods she can have an occasional loose stool.  No blood in her stools.  I had previously offered her referral to genetic counseling for her polyp history, she had not followed up with that.  She otherwise states she has been taking Nexium 20 mg daily for history of reflux for some time.  She denies any breakthrough symptoms on the regimen.  States she watches her diet and generally symptoms are well controlled.  We discussed her long-term history of reflux and long-term risks of chronic PPI use.  Of note she does have osteopenia and had a left leg injury recently but did not fracture it.  She denies any cardiopulmonary symptoms at this time.  Renal function has been normal.  We discussed other ways to manage her reflux.  Most recent endoscopic work-up EGD 03/14/20: Esophagogastric landmarks were identified: the Z-line was found at 34 cm, the gastroesophageal junction was found at 34 cm and the upper extent of the gastric folds was found at 36 cm from the incisors. Findings: - A 2 cm hiatal hernia was present. - The Z-line was irregular with small  islands of salmon colored mucosa. Biopsies were taken with a cold forceps for histology. - The exam of the esophagus was otherwise normal. - The entire examined stomach was normal. - The duodenal bulb and second portion of the duodenum were normal.   Colonoscopy 03/14/20: The perianal and digital rectal examinations were normal. - Two sessile polyps were found in the cecum. The polyps were 2 to 3 mm in size. These polyps were removed with a cold snare. Resection and retrieval were complete. - A 3 to 4 mm polyp was found in the ascending colon. The polyp was sessile. The polyp was removed with a cold snare. Resection and retrieval were complete. - Ten sessile polyps were found in the transverse colon. The polyps were 3 to 6 mm in size. These polyps were removed with a cold snare. Resection and retrieval were complete. - Four sessile polyps were found in the descending colon. The polyps were 3 to 5 mm in size. These polyps were removed with a cold snare. Resection and retrieval were complete. - A 4 mm polyp was found in the sigmoid colon. The polyp was sessile. The polyp was removed with a cold snare. Resection and retrieval were complete. - A few small-mouthed diverticula were found in the sigmoid colon. - The exam was otherwise without abnormality.  Diagnosis 1. Surgical [P], esophagus, GE junction - GASTROESOPHAGEAL MUCOSA WITH MILD INFLAMMATION. - NO INTESTINAL METAPLASIA, DYSPLASIA OR CARCINOMA. 2. Surgical [P],  colon, transverse, cecal, ascending, descending, sigmoid, polyp (18) - TUBULAR ADENOMA(S). - NO HIGH GRADE DYSPLASIA OR CARCINOMA.  Past Medical History:  Diagnosis Date   Allergy    Arthritis    Barrett's esophagus    Cancer ([place])    skin cancer   Chicken pox    Depression    Diabetes mellitus without complication ([place])    Family history of polyps in the colon    GERD (gastroesophageal reflux disease)    Hypertension    Osteopenia    Sleep apnea    borderline-  no cpap use      Past Surgical History:  Procedure Laterality Date   ABDOMINAL HYSTERECTOMY     BREAST BIOPSY  2007   COLONOSCOPY     KNEE ARTHROSCOPY Left    ~15 yrs ago    POLYPECTOMY     TONSILLECTOMY  1949   UPPER GASTROINTESTINAL ENDOSCOPY     Family History  Problem Relation Age of Onset   Breast cancer Mother    Lung cancer Father    Liver cancer Father    Arthritis Sister    Depression Sister    Hyperlipidemia Sister    Hypertension Sister    Alcohol abuse Brother    Arthritis Brother    Depression Brother    Hyperlipidemia Brother    Hypertension Brother    Cancer Daughter    COPD Daughter    Depression Son    Arthritis Maternal Grandmother    Depression Maternal Grandmother    Hyperlipidemia Maternal Grandmother    Hypertension Maternal Grandmother    Stroke Maternal Grandmother    Alcohol abuse Maternal Grandfather    Arthritis Maternal Grandfather    Cancer Maternal Grandfather    Hyperlipidemia Maternal Grandfather    Hypertension Maternal Grandfather    Stroke Maternal Grandfather    Arthritis Paternal Grandmother    Hyperlipidemia Paternal Grandmother    Hypertension Paternal Grandmother    Arthritis Paternal Grandfather    Alcohol abuse Paternal Grandfather    Diabetes Paternal Grandfather    Hyperlipidemia Paternal Grandfather    Heart disease Paternal Grandfather    Hearing loss Paternal Grandfather    Stroke Paternal Grandfather    Colon polyps Neg Hx    Esophageal cancer Neg Hx    Rectal cancer Neg Hx    Stomach cancer Neg Hx    Social History   Tobacco Use   Smoking status: Former   Smokeless tobacco: Never   Tobacco comments:    long ago-   Scientific laboratory technician Use: Never used  Substance Use Topics   Alcohol use: Never   Drug use: Never   Current Outpatient Medications  Medication Sig Dispense Refill   Accu-Chek Softclix Lancets lancets Use once daily Dx E11.9 100 each 0   aspirin 81 MG chewable tablet Chew by mouth daily.      Aspirin Buf,CaCarb-MgCarb-MgO, 81 MG TABS Take by mouth.     atenolol (TENORMIN) 50 MG tablet TAKE 1 TABLET BY MOUTH EVERY DAY 90 tablet 1   atorvastatin (LIPITOR) 20 MG tablet TAKE 1 TABLET BY MOUTH EVERY DAY 90 tablet 1   Blood Glucose Monitoring Suppl (ACCU-CHEK GUIDE ME) w/Device KIT Use daily for glucose control . Dx E11.9 1 kit 0   Calcium Carbonate-Vitamin D (CALCIUM-D PO) Take by mouth. Caltrate plus D     cycloSPORINE (RESTASIS) 0.05 % ophthalmic emulsion 1 drop 2 (two) times daily.     esomeprazole (NEXIUM) 20 MG  capsule Take 20 mg by mouth daily at 12 noon.     glipiZIDE (GLUCOTROL) 5 MG tablet Take 1 tablet (5 mg total) by mouth daily before breakfast. 90 tablet 3   glucose blood (ACCU-CHEK GUIDE) test strip Test once daily for glucose control.  Dx E11.9 100 each 12   hydrochlorothiazide (HYDRODIURIL) 25 MG tablet TAKE 1 TABLET (25 MG TOTAL) BY MOUTH DAILY. 90 tablet 1   metFORMIN (GLUCOPHAGE) 500 MG tablet Take 2 tablets (1,000 mg total) by mouth 2 (two) times daily with a meal. 360 tablet 3   Multiple Vitamin (MULTIVITAMIN) capsule Take by mouth.     Semaglutide,0.25 or 0.5MG /DOS, (OZEMPIC, 0.25 OR 0.5 MG/DOSE,) 2 MG/1.5ML SOPN Inject 0.5 mg into the skin once a week. 4.5 mL 3   traZODone (DESYREL) 150 MG tablet TAKE 1/2 TABLET IN THE EVENING 45 tablet 1   vitamin E 1000 UNIT capsule Take 1,000 Units by mouth daily.     No current facility-administered medications for this visit.   Allergies  Allergen Reactions   Penicillins Anaphylaxis   Whey Protein [Protein] Shortness Of Breath   Lisinopril Other (See Comments) and Swelling   Losartan Potassium Swelling   Losartan Nausea And Vomiting     Review of Systems: All systems reviewed and negative except where noted in HPI.    Lab Results  Component Value Date   WBC 8.6 12/20/2020   HGB 13.5 12/20/2020   HCT 40.0 12/20/2020   MCV 90.9 12/20/2020   PLT 236.0 12/20/2020    Lab Results  Component Value Date    CREATININE 0.92 12/20/2020   BUN 13 12/20/2020   NA 139 12/20/2020   K 4.5 12/20/2020   CL 99 12/20/2020   CO2 28 12/20/2020      Physical Exam: BP 130/68    Pulse 76    Ht 5\' 4"  (1.626 m)    Wt 227 lb (103 kg)    SpO2 96%    BMI 38.96 kg/m  Constitutional: Pleasant,well-developed, female in no acute distress. Neurological: Alert and oriented to person place and time. Psychiatric: Normal mood and affect. Behavior is normal.   ASSESSMENT AND PLAN: 76 year old female here for reassessment of the following:  History of colon polyps GERD Long-term use of PPI  18 adenomas on most recent colonoscopy and has had multiple colonoscopies with other adenomas removed in the past.  She is otherwise in good health at her age.  We discussed if she wanted to have any further surveillance exams.  Given the burden of polyps she had in her last exam, I think another colonoscopy at this time is reasonable.  If she has no high risk polyps or high burden of polyps on this exam we may forego further surveillance given her age.  I discussed risk benefits of colonoscopy and anesthesia and she wanted to proceed.  Her daughter was present for the encounter today, we discussed her polyp history and if they want to pursue a genetic evaluation, which could influence the patient's children.  After discussion they were interested in being referred to genetics and that was placed for them.  We otherwise spent several minutes discussing her long-term reflux and use of chronic PPI.  She does have history of osteopenia, at increased risk for fracture chronic PPI.  We also discussed other risks.  Long-term want to use lowest dose of PPI needed to control symptoms with use of an alternative.  She has not tried an alternative recently, we discussed how  to taper down omeprazole, use every other day for a few weeks and then transition to Pepcid daily if she tolerates.  However if she has frequent reflux or does not tolerate  Pepcid she can resume Nexium daily if she would prefer that, understanding risks.  Plan: - schedule for colonoscopy - referral to genetics - counseled on long term risks of chronic PPI use - taper off nexium as tolerated (no evidence of BE on most recent EGD), trial of pepcid monotherapy  Jolly Mango, MD [facility]

## 2021-11-14 NOTE — Patient Instructions (Addendum)
If you are age 78 or older, your body mass index should be between 23-30. Your Body mass index is 38.96 kg/m. If this is out of the aforementioned range listed, please consider follow up with your Primary Care Provider.  If you are age 8 or younger, your body mass index should be between 19-25. Your Body mass index is 38.96 kg/m. If this is out of the aformentioned range listed, please consider follow up with your Primary Care Provider.   ________________________________________________________  The [facility] providers would like to encourage you to use [facility] to communicate with providers for non-urgent requests or questions.  Due to long hold times on the telephone, sending your provider a message by [facility] may be a faster and more efficient way to get a response.  Please allow 48 business hours for a response.  Please remember that this is for non-urgent requests.  _______________________________________________________  Diane Decker have been scheduled for a colonoscopy. Please follow written instructions given to you at your visit today.  Please pick up your prep supplies at the pharmacy within the next 1-3 days. If you use inhalers (even only as needed), please bring them with you on the day of your procedure.   Decrease your Nexium to every other day. Continue Pepcid  We are referring you to Genetic Testing.  They will contact you directly to schedule an appointment.  It may take a week or more before you hear from them.  Please feel free to contact us if you have not heard from them within 2 weeks and we will follow up on the referral.   Thank you for entrusting me with your care and for choosing [facility], Dr. Adell Cellar

## 2021-12-18 ENCOUNTER — Encounter: Payer: Self-pay | Admitting: Internal Medicine

## 2021-12-18 ENCOUNTER — Ambulatory Visit: Payer: Medicare PPO | Admitting: Internal Medicine

## 2021-12-18 ENCOUNTER — Other Ambulatory Visit: Payer: Self-pay

## 2021-12-18 VITALS — BP 126/80 | HR 70 | Ht 64.0 in | Wt 228.0 lb

## 2021-12-18 DIAGNOSIS — E119 Type 2 diabetes mellitus without complications: Secondary | ICD-10-CM | POA: Diagnosis not present

## 2021-12-18 LAB — POCT GLUCOSE (DEVICE FOR HOME USE): Glucose Fasting, POC: 75 mg/dL (ref 70–99)

## 2021-12-18 MED ORDER — OZEMPIC (1 MG/DOSE) 4 MG/3ML ~~LOC~~ SOPN: 1.0000 mg | PEN_INJECTOR | SUBCUTANEOUS | 3 refills | Status: DC

## 2021-12-18 MED ORDER — GLIPIZIDE 5 MG PO TABS: 2.5000 mg | ORAL_TABLET | Freq: Every day | ORAL | 3 refills | Status: DC

## 2021-12-18 NOTE — Progress Notes (Signed)
Name: Diane Decker  Age/ Sex: 76 y.o., female   MRN/ DOB: [id number], [birth year]     PCP: Diane Decker, Diane Halsted, MD   Reason for Endocrinology Evaluation: Type 2 Diabetes Mellitus  Initial Endocrine Consultative Visit: 11/10/2020    PATIENT IDENTIFIER: [name] is a 76 y.o. female with a past medical history of T2DM, barrett's esophagus. The patient has followed with Endocrinology clinic since 11/10/2020 for consultative assistance with management of her diabetes.  DIABETIC HISTORY:  [name] was diagnosed with DM at age 73, ozempic caused GI side effects. . Her hemoglobin A1c has ranged from 6.6%  in 2021, peaking at 9.7 %in 2021.    On her initial visit to our clinic her A1c was 6.6 % she was on Glipizide XL, Metformin and wanted to give ozempic another try. We swiched XL glipizide to regular release     SUBJECTIVE:   During the last visit (06/15/2021): A1c 6.7% continued  Ozempic, Glipizide and Metformin     Today (12/18/2021): [name]  is here for a follow up on diabetes management.  She checks her blood sugars 1 time a day. The patient has not  had hypoglycemic episodes since the last clinic visit.   Denies nausea or diarrhea   She has been noted with weight increase    HOME DIABETES REGIMEN:  Glipizide 5 mg, 1 tablet before Breakfast  Metformin 500 mg , 2 tablets with Breakfast and 2 tablet with supper  Ozempic 0.5 mg once weekly     Statin: yes  ACE-I/ARB: Intolerant to lisinopril and losartan    METER DOWNLOAD SUMMARY: Forgot meter today   DIABETIC COMPLICATIONS: Microvascular complications:   Denies: CKD, retinopathy, neuropathy  Last Eye Exam: Completed 10/2021  Macrovascular complications:   Denies: CAD, CVA, PVD   HISTORY:  Past Medical History:  Past Medical History:  Diagnosis Date   Allergy    Arthritis    Barrett's esophagus    Cancer ([name])    skin cancer   Chicken pox     Depression    Diabetes mellitus without complication ([place])    Family history of polyps in the colon    GERD (gastroesophageal reflux disease)    Hypertension    Osteopenia    Sleep apnea    borderline- no cpap use    Past Surgical History:  Past Surgical History:  Procedure Laterality Date   ABDOMINAL HYSTERECTOMY     BREAST BIOPSY  2007   COLONOSCOPY     KNEE ARTHROSCOPY Left    ~15 yrs ago    POLYPECTOMY     TONSILLECTOMY  1949   UPPER GASTROINTESTINAL ENDOSCOPY     Social History:  reports that she has quit smoking. She has never used smokeless tobacco. She reports that she does not drink alcohol and does not use drugs. Family History:  Family History  Problem Relation Age of Onset   Breast cancer Mother    Lung cancer Father    Liver cancer Father    Arthritis Sister    Depression Sister    Hyperlipidemia Sister    Hypertension Sister    Alcohol abuse Brother    Arthritis Brother    Depression Brother    Hyperlipidemia Brother    Hypertension Brother    Cancer Daughter    COPD Daughter    Depression Son    Arthritis Maternal Grandmother    Depression Maternal Grandmother    Hyperlipidemia Maternal Grandmother  Hypertension Maternal Grandmother    Stroke Maternal Grandmother    Alcohol abuse Maternal Grandfather    Arthritis Maternal Grandfather    Cancer Maternal Grandfather    Hyperlipidemia Maternal Grandfather    Hypertension Maternal Grandfather    Stroke Maternal Grandfather    Arthritis Paternal Grandmother    Hyperlipidemia Paternal Grandmother    Hypertension Paternal Grandmother    Arthritis Paternal Grandfather    Alcohol abuse Paternal Grandfather    Diabetes Paternal Grandfather    Hyperlipidemia Paternal Grandfather    Heart disease Paternal Grandfather    Hearing loss Paternal Grandfather    Stroke Paternal Grandfather    Colon polyps Neg Hx    Esophageal cancer Neg Hx    Rectal cancer Neg Hx    Stomach cancer Neg Hx      HOME  MEDICATIONS: Allergies as of 12/18/2021       Reactions   Penicillins Anaphylaxis   Whey Protein [protein] Shortness Of Breath   Lisinopril Other (See Comments), Swelling   Losartan Potassium Swelling   Losartan Nausea And Vomiting        Medication List        Accurate as of December 18, 2021  1:12 PM. If you have any questions, ask your nurse or doctor.          Accu-Chek Guide Me w/Device Kit Use daily for glucose control . Dx E11.9   Accu-Chek Guide test strip Generic drug: glucose blood Test once daily for glucose control.  Dx E11.9   Accu-Chek Softclix Lancets lancets Use once daily Dx E11.9   aspirin 81 MG chewable tablet Chew by mouth daily.   Aspirin Buf(CaCarb-MgCarb-MgO) 81 MG Tabs Take by mouth.   atenolol 50 MG tablet Commonly known as: TENORMIN TAKE 1 TABLET BY MOUTH EVERY DAY   atorvastatin 20 MG tablet Commonly known as: LIPITOR TAKE 1 TABLET BY MOUTH EVERY DAY   CALCIUM-D PO Take by mouth. Caltrate plus D   cycloSPORINE 0.05 % ophthalmic emulsion Commonly known as: RESTASIS 1 drop 2 (two) times daily.   esomeprazole 20 MG capsule Commonly known as: NEXIUM Take 20 mg by mouth every other day.   glipiZIDE 5 MG tablet Commonly known as: GLUCOTROL Take 1 tablet (5 mg total) by mouth daily before breakfast.   hydrochlorothiazide 25 MG tablet Commonly known as: HYDRODIURIL TAKE 1 TABLET (25 MG TOTAL) BY MOUTH DAILY.   metFORMIN 500 MG tablet Commonly known as: GLUCOPHAGE Take 2 tablets (1,000 mg total) by mouth 2 (two) times daily with a meal.   multivitamin capsule Take by mouth.   Ozempic (0.25 or 0.5 MG/DOSE) 2 MG/1.5ML Sopn Generic drug: Semaglutide(0.25 or 0.5MG /DOS) Inject 0.5 mg into the skin once a week.   traZODone 150 MG tablet Commonly known as: DESYREL TAKE 1/2 TABLET IN THE EVENING   vitamin E 1000 UNIT capsule Take 1,000 Units by mouth daily.         OBJECTIVE:   Vital Signs: BP 126/80 (BP Location:  Left Arm, Patient Position: Sitting, Cuff Size: Large)    Pulse 70    Ht 5\' 4"  (1.626 m)    Wt 228 lb (103.4 kg)    SpO2 95%    BMI 39.14 kg/m   Wt Readings from Last 3 Encounters:  12/18/21 228 lb (103.4 kg)  11/14/21 227 lb (103 kg)  11/13/21 228 lb (103.4 kg)     Exam: General: Pt appears well and is in NAD  Lungs: Clear with good  BS bilat   Heart: RRR   Extremities: No pretibial edema.   Neuro: MS is good with appropriate affect, pt is alert and Ox3   DM foot exam: 12/18/2021   The skin of the feet is intact without sores or ulcerations. The pedal pulses are 2+ on right and 2+ on left. The sensation is intact to a screening 5.07, 10 gram monofilament bilaterally     DATA REVIEWED:  Lab Results  Component Value Date   HGBA1C 6.9 11/06/2021   HGBA1C 6.7 (A) 06/15/2021   HGBA1C 6.7 (A) 02/09/2021   Lab Results  Component Value Date   MICROALBUR 1.7 02/09/2021   LDLCALC 42 12/20/2020   CREATININE 0.92 12/20/2020    Lab Results  Component Value Date   CHOL 121 12/20/2020   HDL 52.10 12/20/2020   LDLCALC 42 12/20/2020   TRIG 135.0 12/20/2020   CHOLHDL 2 12/20/2020         ASSESSMENT / PLAN / RECOMMENDATIONS:   1) Type 2 Diabetes Mellitus, Optimally controlled, Without complications - Most recent A1c of 6.9  %. Goal A1c < 7.0 %.    -A1c continues to be at goal - We discussed increasing Ozempic, in the meantime will reduce glipizide to reduce risk of hypoglycemia. In office BG 74 mg/dL    MEDICATIONS: Decrease  glipizide 5 mg, half a tablet before breakfast Continue metformin 500 mg, 2 tablets twice daily Increase  Ozempic  1 mg weekly  EDUCATION / INSTRUCTIONS: BG monitoring instructions: Patient is instructed to check her blood sugars 1 times a day, fsting Call [facility] [place] [facility] if: BG persistently < 70  I reviewed the Rule of 15 for the treatment of hypoglycemia in detail with the patient. Literature supplied.   2) Diabetic complications:   Eye: Does not have known diabetic retinopathy.  Neuro/ Feet: Does not have known diabetic peripheral neuropathy .  Renal: Patient does not have known baseline CKD. She   is intolerant to  ACEI/ARB    3) Dyslipidemia :  - LDL at goal of 42 mg/dL    Medication  Continue Atorvastatin 20 mg daily      F/U in 6 months   Signed electronically by: Mack Guise, MD  [facility]  [address] [place],  [address] Phone: [phone] FAX: [phone]   CC: Diane Decker, Diane Halsted, MD [place] [address] Phone: [phone]  Fax: [phone]  Return to Endocrinology clinic as below: Future Appointments  Date Time Provider [place]  12/18/2021  1:20 PM Syria Kestner, Melanie Crazier, MD [facility] None  12/28/2021 11:00 AM Armbruster, Carlota Raspberry, MD [facility]  05/07/2022  3:30 PM Diane Decker, Diane Halsted, MD [facility]

## 2021-12-18 NOTE — Patient Instructions (Addendum)
--   Increase  Ozempic 1 mg once weekly  - Decrease  Glipizide 5 mg, Half a  tablet before Breakfast  - Continue Metformin 500 mg , 2 tablets with Breakfast and 2 tablet with supper     HOW TO TREAT LOW BLOOD SUGARS (Blood sugar LESS THAN 70 MG/DL) Please follow the RULE OF 15 for the treatment of hypoglycemia treatment (when your (blood sugars are less than 70 mg/dL)   STEP 1: Take 15 grams of carbohydrates when your blood sugar is low, which includes:  3-4 GLUCOSE TABS  OR 3-4 OZ OF JUICE OR REGULAR SODA OR ONE TUBE OF GLUCOSE GEL    STEP 2: RECHECK blood sugar in 15 MINUTES STEP 3: If your blood sugar is still low at the 15 minute recheck --> then, go back to STEP 1 and treat AGAIN with another 15 grams of carbohydrates.

## 2021-12-25 ENCOUNTER — Encounter: Payer: Self-pay | Admitting: Gastroenterology

## 2021-12-27 ENCOUNTER — Telehealth: Payer: Self-pay | Admitting: Internal Medicine

## 2021-12-27 NOTE — Telephone Encounter (Signed)
Received a phone call to the [place] On [facility] pager. Patient states that about 30 minutes after she ingested her first portion of the tablets, she threw up. She did not see any tablets in the emesis, but she did note that the vomitus was bitter. She drank the 16 ounces of liquids afterwards and was able to tolerate the liquids. She is already feeling some of the effects of the prep and is now sitting over the toilet. I instructed her to keep going with her prep instructions and for the second portion of the tablets to take the tablets one at a time, waiting a few minutes in between. She can ingest colder liquids over ice to help with nausea issues. She should make sure that she completes all liquids at least 2 hours before her procedure. Offered her antinausea medications, but she states that she is not interested at this time. Will CC Dr. Havery Moros to this telephone call. ?

## 2021-12-28 ENCOUNTER — Ambulatory Visit (AMBULATORY_SURGERY_CENTER): Payer: Medicare PPO | Admitting: Gastroenterology

## 2021-12-28 ENCOUNTER — Other Ambulatory Visit: Payer: Self-pay

## 2021-12-28 ENCOUNTER — Encounter: Payer: Self-pay | Admitting: Gastroenterology

## 2021-12-28 VITALS — BP 133/67 | HR 69 | Temp 97.7°F | Resp 12 | Ht 64.0 in | Wt 227.0 lb

## 2021-12-28 DIAGNOSIS — D124 Benign neoplasm of descending colon: Secondary | ICD-10-CM | POA: Diagnosis not present

## 2021-12-28 DIAGNOSIS — D125 Benign neoplasm of sigmoid colon: Secondary | ICD-10-CM

## 2021-12-28 DIAGNOSIS — Z86010 Personal history of colonic polyps: Secondary | ICD-10-CM | POA: Diagnosis not present

## 2021-12-28 DIAGNOSIS — D123 Benign neoplasm of transverse colon: Secondary | ICD-10-CM

## 2021-12-28 MED ORDER — SODIUM CHLORIDE 0.9 % IV SOLN
500.0000 mL | Freq: Once | INTRAVENOUS | Status: DC
Start: 2021-12-28 — End: 2021-12-28

## 2021-12-28 NOTE — Telephone Encounter (Signed)
Diane Decker thanks for your help with her overnight, she did well and got through the prep. Appreciate it. ? ?

## 2021-12-28 NOTE — Progress Notes (Signed)
[facility] History and Physical   Primary Care Physician:  Philip Aspen, Limmie Patricia, MD   Reason for Procedure:   History of colon polyps  Plan:    colonoscopy     HPI: Diane Decker is a 76 y.o. female  here for colonoscopy surveillance - 18 adenomas removed 02/2020, and has had multiple polyps removed prior to that. Patient denies any bowel symptoms at this time. No family history of colon cancer known. Otherwise feels well without any cardiopulmonary symptoms.    Past Medical History:  Diagnosis Date   Allergy    Arthritis    Barrett's esophagus    Cancer (HCC)    skin cancer   Chicken pox    Depression    Diabetes mellitus without complication (HCC)    Family history of polyps in the colon    GERD (gastroesophageal reflux disease)    Hypertension    Osteopenia    Sleep apnea    borderline- no cpap use     Past Surgical History:  Procedure Laterality Date   ABDOMINAL HYSTERECTOMY     BREAST BIOPSY  2007   COLONOSCOPY     KNEE ARTHROSCOPY Left    ~15 yrs ago    POLYPECTOMY     TONSILLECTOMY  1949   UPPER GASTROINTESTINAL ENDOSCOPY      Prior to Admission medications   Medication Sig Start Date End Date Taking? Authorizing Provider  Accu-Chek Softclix Lancets lancets Use once daily Dx E11.9 10/26/20  Yes Philip Aspen, Limmie Patricia, MD  aspirin 81 MG chewable tablet Chew by mouth daily.   Yes [provider]  atenolol (TENORMIN) 50 MG tablet TAKE 1 TABLET BY MOUTH EVERY DAY 10/27/21  Yes Philip Aspen, Limmie Patricia, MD  atorvastatin (LIPITOR) 20 MG tablet TAKE 1 TABLET BY MOUTH EVERY DAY 08/30/21  Yes Philip Aspen, Limmie Patricia, MD  Blood Glucose Monitoring Suppl (ACCU-CHEK GUIDE ME) w/Device KIT Use daily for glucose control . Dx E11.9 09/14/20  Yes Philip Aspen, Limmie Patricia, MD  Calcium Carbonate-Vitamin D (CALCIUM-D PO) Take by mouth. Caltrate plus D   Yes [provider]  cycloSPORINE (RESTASIS) 0.05 % ophthalmic  emulsion 1 drop 2 (two) times daily.   Yes [provider]  glipiZIDE (GLUCOTROL) 5 MG tablet Take 0.5 tablets (2.5 mg total) by mouth daily before breakfast. 12/18/21  Yes Shamleffer, Konrad Dolores, MD  glucose blood (ACCU-CHEK GUIDE) test strip Test once daily for glucose control.  Dx E11.9 11/10/20  Yes Shamleffer, Konrad Dolores, MD  hydrochlorothiazide (HYDRODIURIL) 25 MG tablet TAKE 1 TABLET (25 MG TOTAL) BY MOUTH DAILY. 06/06/21  Yes Philip Aspen, Limmie Patricia, MD  metFORMIN (GLUCOPHAGE) 500 MG tablet Take 2 tablets (1,000 mg total) by mouth 2 (two) times daily with a meal. 06/15/21  Yes Shamleffer, Konrad Dolores, MD  Multiple Vitamin (MULTIVITAMIN) capsule Take by mouth.   Yes [provider]  Semaglutide, 1 MG/DOSE, (OZEMPIC, 1 MG/DOSE,) 4 MG/3ML SOPN Inject 1 mg into the skin once a week. 12/18/21  Yes Shamleffer, Konrad Dolores, MD  traZODone (DESYREL) 150 MG tablet TAKE 1/2 TABLET IN THE EVENING 09/29/21  Yes Philip Aspen, Limmie Patricia, MD  Aspirin Buf,CaCarb-MgCarb-MgO, 81 MG TABS Take by mouth. Patient not taking: Reported on 12/28/2021    [provider]  esomeprazole (NEXIUM) 20 MG capsule Take 20 mg by mouth every other day. Patient not taking: Reported on 12/28/2021    [provider]  vitamin E 1000 UNIT capsule Take 1,000 Units  by mouth daily. Patient not taking: Reported on 12/28/2021    [provider]    Current Outpatient Medications  Medication Sig Dispense Refill   Accu-Chek Softclix Lancets lancets Use once daily Dx E11.9 100 each 0   aspirin 81 MG chewable tablet Chew by mouth daily.     atenolol (TENORMIN) 50 MG tablet TAKE 1 TABLET BY MOUTH EVERY DAY 90 tablet 1   atorvastatin (LIPITOR) 20 MG tablet TAKE 1 TABLET BY MOUTH EVERY DAY 90 tablet 1   Blood Glucose Monitoring Suppl (ACCU-CHEK GUIDE ME) w/Device KIT Use daily for glucose control . Dx E11.9 1 kit 0   Calcium Carbonate-Vitamin D (CALCIUM-D PO) Take by mouth. Caltrate  plus D     cycloSPORINE (RESTASIS) 0.05 % ophthalmic emulsion 1 drop 2 (two) times daily.     glipiZIDE (GLUCOTROL) 5 MG tablet Take 0.5 tablets (2.5 mg total) by mouth daily before breakfast. 45 tablet 3   glucose blood (ACCU-CHEK GUIDE) test strip Test once daily for glucose control.  Dx E11.9 100 each 12   hydrochlorothiazide (HYDRODIURIL) 25 MG tablet TAKE 1 TABLET (25 MG TOTAL) BY MOUTH DAILY. 90 tablet 1   metFORMIN (GLUCOPHAGE) 500 MG tablet Take 2 tablets (1,000 mg total) by mouth 2 (two) times daily with a meal. 360 tablet 3   Multiple Vitamin (MULTIVITAMIN) capsule Take by mouth.     Semaglutide, 1 MG/DOSE, (OZEMPIC, 1 MG/DOSE,) 4 MG/3ML SOPN Inject 1 mg into the skin once a week. 9 mL 3   traZODone (DESYREL) 150 MG tablet TAKE 1/2 TABLET IN THE EVENING 45 tablet 1   Aspirin Buf,CaCarb-MgCarb-MgO, 81 MG TABS Take by mouth. (Patient not taking: Reported on 12/28/2021)     esomeprazole (NEXIUM) 20 MG capsule Take 20 mg by mouth every other day. (Patient not taking: Reported on 12/28/2021)     vitamin E 1000 UNIT capsule Take 1,000 Units by mouth daily. (Patient not taking: Reported on 12/28/2021)     Current Facility-Administered Medications  Medication Dose Route Frequency Provider Last Rate Last Admin   0.9 %  sodium chloride infusion  500 mL Intravenous Once Dewayne Jurek, Willaim Rayas, MD        Allergies as of 12/28/2021 - Review Complete 12/28/2021  Allergen Reaction Noted   Penicillins Anaphylaxis 09/29/2019   Whey protein [protein] Shortness Of Breath 03/02/2020   Lisinopril Other (See Comments) and Swelling 06/16/2012   Losartan potassium Swelling 06/16/2012   Losartan Nausea And Vomiting 05/28/2017    Family History  Problem Relation Age of Onset   Breast cancer Mother    Lung cancer Father    Liver cancer Father    Arthritis Sister    Depression Sister    Hyperlipidemia Sister    Hypertension Sister    Alcohol abuse Brother    Arthritis Brother    Depression Brother     Hyperlipidemia Brother    Hypertension Brother    Cancer Daughter    COPD Daughter    Depression Son    Arthritis Maternal Grandmother    Depression Maternal Grandmother    Hyperlipidemia Maternal Grandmother    Hypertension Maternal Grandmother    Stroke Maternal Grandmother    Alcohol abuse Maternal Grandfather    Arthritis Maternal Grandfather    Cancer Maternal Grandfather    Hyperlipidemia Maternal Grandfather    Hypertension Maternal Grandfather    Stroke Maternal Grandfather    Arthritis Paternal Grandmother    Hyperlipidemia Paternal Grandmother    Hypertension Paternal Grandmother  Arthritis Paternal Grandfather    Alcohol abuse Paternal Grandfather    Diabetes Paternal Grandfather    Hyperlipidemia Paternal Grandfather    Heart disease Paternal Grandfather    Hearing loss Paternal Grandfather    Stroke Paternal Grandfather    Colon polyps Neg Hx    Esophageal cancer Neg Hx    Rectal cancer Neg Hx    Stomach cancer Neg Hx     Social History   Socioeconomic History   Marital status: Widowed    Spouse name: Not on file   Number of children: Not on file   Years of education: Not on file   Highest education level: Not on file  Occupational History   Not on file  Tobacco Use   Smoking status: Former   Smokeless tobacco: Never   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  Physical Exam: Vital  signs BP (!) 155/100   Pulse 75   Temp 97.7 F (36.5 C)   Ht 5\' 4"  (1.626 m)   Wt 227 lb (103 kg)   SpO2 98%   BMI 38.96 kg/m   General:   Alert,  Well-developed, pleasant and cooperative in NAD Lungs:  Clear throughout to auscultation.   Heart:  Regular rate and rhythm Abdomen:  Soft, nontender and nondistended.   Neuro/Psych:  Alert and cooperative. Normal mood and affect. A and O x 3  Harlin Rain, MD [facility]

## 2021-12-28 NOTE — Progress Notes (Signed)
Called to room to assist during endoscopic procedure.  Patient ID and intended procedure confirmed with present staff. Received instructions for my participation in the procedure from the performing physician.  

## 2021-12-28 NOTE — Progress Notes (Signed)
Sedate, gd SR, tolerated procedure well, VSS, report to RN 

## 2021-12-28 NOTE — Patient Instructions (Signed)
Thank you for letting us care for you today! ?Congratulations for graduating from routine colonoscopies! ?Resume previous diet and medications today. ?Return to normal daily activities tomorrow. ?Pathology of polyps removed will be available in   1-2 weeks. ? ? ?YOU HAD AN ENDOSCOPIC PROCEDURE TODAY AT [facility]:   Refer to the procedure report that was given to you for any specific questions about what was found during the examination.  If the procedure report does not answer your questions, please call your gastroenterologist to clarify.  If you requested that your care partner not be given the details of your procedure findings, then the procedure report has been included in a sealed envelope for you to review at your convenience later. ? ?YOU SHOULD EXPECT: Some feelings of bloating in the abdomen. Passage of more gas than usual.  Walking can help get rid of the air that was put into your GI tract during the procedure and reduce the bloating. If you had a lower endoscopy (such as a colonoscopy or flexible sigmoidoscopy) you may notice spotting of blood in your stool or on the toilet paper. If you underwent a bowel prep for your procedure, you may not have a normal bowel movement for a few days. ? ?Please Note:  You might notice some irritation and congestion in your nose or some drainage.  This is from the oxygen used during your procedure.  There is no need for concern and it should clear up in a day or so. ? ?SYMPTOMS TO REPORT IMMEDIATELY: ? ?Following lower endoscopy (colonoscopy or flexible sigmoidoscopy): ? Excessive amounts of blood in the stool ? Significant tenderness or worsening of abdominal pains ? Swelling of the abdomen that is new, acute ? Fever of 100?F or high ? ? ?For urgent or emergent issues, a gastroenterologist can be reached at any hour by calling [phone]. ?Do not use MyChart messaging for urgent concerns.  ? ? ?DIET:  We do recommend a small meal at first, but  then you may proceed to your regular diet.  Drink plenty of fluids but you should avoid alcoholic beverages for 24 hours. ? ?ACTIVITY:  You should plan to take it easy for the rest of today and you should NOT DRIVE or use heavy machinery until tomorrow (because of the sedation medicines used during the test).   ? ?FOLLOW UP: ?Our staff will call the number listed on your records 48-72 hours following your procedure to check on you and address any questions or concerns that you may have regarding the information given to you following your procedure. If we do not reach you, we will leave a message.  We will attempt to reach you two times.  During this call, we will ask if you have developed any symptoms of COVID 19. If you develop any symptoms (ie: fever, flu-like symptoms, shortness of breath, cough etc.) before then, please call [phone].  If you test positive for Covid 19 in the 2 weeks post procedure, please call and report this information to [place].   ? ?If any biopsies were taken you will be contacted by phone or by letter within the next 1-3 weeks.  Please call us at [phone] if you have not heard about the biopsies in 3 weeks.  ? ? ?SIGNATURES/CONFIDENTIALITY: ?You and/or your care partner have signed paperwork which will be entered into your electronic medical record.  These signatures attest to the fact that that the information above on your After Visit Summary  has been reviewed and is understood.  Full responsibility of the confidentiality of this discharge information lies with you and/or your care-partner.  ?

## 2021-12-28 NOTE — Op Note (Signed)
[place] ?Patient Name: Diane Decker ?Procedure Date: 12/28/2021 11:11 AM ?MRN: [id number] ?Endoscopist: Carlota Raspberry. Havery Moros , MD ?Age: 76 ?Referring MD:  ?Date of Birth: [birth year] ?Gender: Female ?Account #: [id number] ?Procedure:                Colonoscopy ?Indications:              High risk colon cancer surveillance: Personal  ?                          history of colonic polyps - 18 adenomas removed  ?                          02/2020 ?Medicines:                Monitored Anesthesia Care ?Procedure:                Pre-Anesthesia Assessment: ?                          - Prior to the procedure, a History and Physical  ?                          was performed, and patient medications and  ?                          allergies were reviewed. The patient's tolerance of  ?                          previous anesthesia was also reviewed. The risks  ?                          and benefits of the procedure and the sedation  ?                          options and risks were discussed with the patient.  ?                          All questions were answered, and informed consent  ?                          was obtained. Prior Anticoagulants: The patient has  ?                          taken no previous anticoagulant or antiplatelet  ?                          agents. ASA Grade Assessment: III - A patient with  ?                          severe systemic disease. After reviewing the risks  ?                          and benefits, the patient was deemed in  ?  satisfactory condition to undergo the procedure. ?                          After obtaining informed consent, the colonoscope  ?                          was passed under direct vision. Throughout the  ?                          procedure, the patient's blood pressure, pulse, and  ?                          oxygen saturations were monitored continuously. The  ?                          CF [id number] #[id number] was introduced through the  anus  ?                          and advanced to the the cecum, identified by  ?                          appendiceal orifice and ileocecal valve. The  ?                          colonoscopy was performed without difficulty. The  ?                          patient tolerated the procedure well. The quality  ?                          of the bowel preparation was good. The ileocecal  ?                          valve, appendiceal orifice, and rectum were  ?                          photographed. ?Scope In: 11:23:16 AM ?Scope Out: 11:42:27 AM ?Scope Withdrawal Time: 0 hours 13 minutes 25 seconds  ?Total Procedure Duration: 0 hours 19 minutes 11 seconds  ?Findings:                 The perianal and digital rectal examinations were  ?                          normal. ?                          A 3 mm polyp was found in the transverse colon. The  ?                          polyp was sessile. The polyp was removed with a  ?                          cold snare. Resection and retrieval were complete. ?  A 3 mm polyp was found in the descending colon. The  ?                          polyp was sessile. The polyp was removed with a  ?                          cold snare. Resection and retrieval were complete. ?                          A 3 mm polyp was found in the sigmoid colon. The  ?                          polyp was sessile. The polyp was removed with a  ?                          cold snare. Resection and retrieval were complete. ?                          Internal hemorrhoids were found during retroflexion. ?                          The exam was otherwise without abnormality. ?Complications:            No immediate complications. Estimated blood loss:  ?                          Minimal. ?Estimated Blood Loss:     Estimated blood loss was minimal. ?Impression:               - One 3 mm polyp in the transverse colon, removed  ?                          with a cold snare. Resected and retrieved. ?                           - One 3 mm polyp in the descending colon, removed  ?                          with a cold snare. Resected and retrieved. ?                          - One 3 mm polyp in the sigmoid colon, removed with  ?                          a cold snare. Resected and retrieved. ?                          - Internal hemorrhoids. ?                          - The examination was otherwise normal. ?Recommendation:           - Patient has a contact number available for  ?  emergencies. The signs and symptoms of potential  ?                          delayed complications were discussed with the  ?                          patient. Return to normal activities tomorrow.  ?                          Written discharge instructions were provided to the  ?                          patient. ?                          - Resume previous diet. ?                          - Continue present medications. ?                          - Await pathology results. ?                          - No further surveillance recommended due to age  ?                          and no high risk lesions on this exam ?Carlota Raspberry. Benny Deutschman, MD ?12/28/2021 11:46:20 AM ?This report has been signed electronically. ?

## 2022-01-01 ENCOUNTER — Telehealth: Payer: Self-pay

## 2022-01-01 NOTE — Telephone Encounter (Signed)
?  Follow up Call- ? ?Call back number 12/28/2021 03/14/2020  ?Post procedure Call Back phone  # [phone]  ?Permission to leave phone message Yes Yes  ?Some recent data might be hidden  ?  ? ?Patient questions: ? ?Do you have a fever, pain , or abdominal swelling? No. ?Pain Score  0 * ? ?Have you tolerated food without any problems? Yes.   ? ?Have you been able to return to your normal activities? Yes.   ? ?Do you have any questions about your discharge instructions: ?Diet   No. ?Medications  No. ?Follow up visit  No. ? ?Do you have questions or concerns about your Care? No. ? ?Actions: ?* If pain score is 4 or above: ?No action needed, pain <4. ? ? ?

## 2022-03-04 IMAGING — MG DIGITAL SCREENING BILAT W/ CAD
5 series · 5 of 5 positions shown · non-contrast
Comparison: Previous exam(s).

CLINICAL DATA: Screening.

EXAM:
DIGITAL SCREENING BILATERAL MAMMOGRAM WITH CAD

[L CC (1 of 2)]
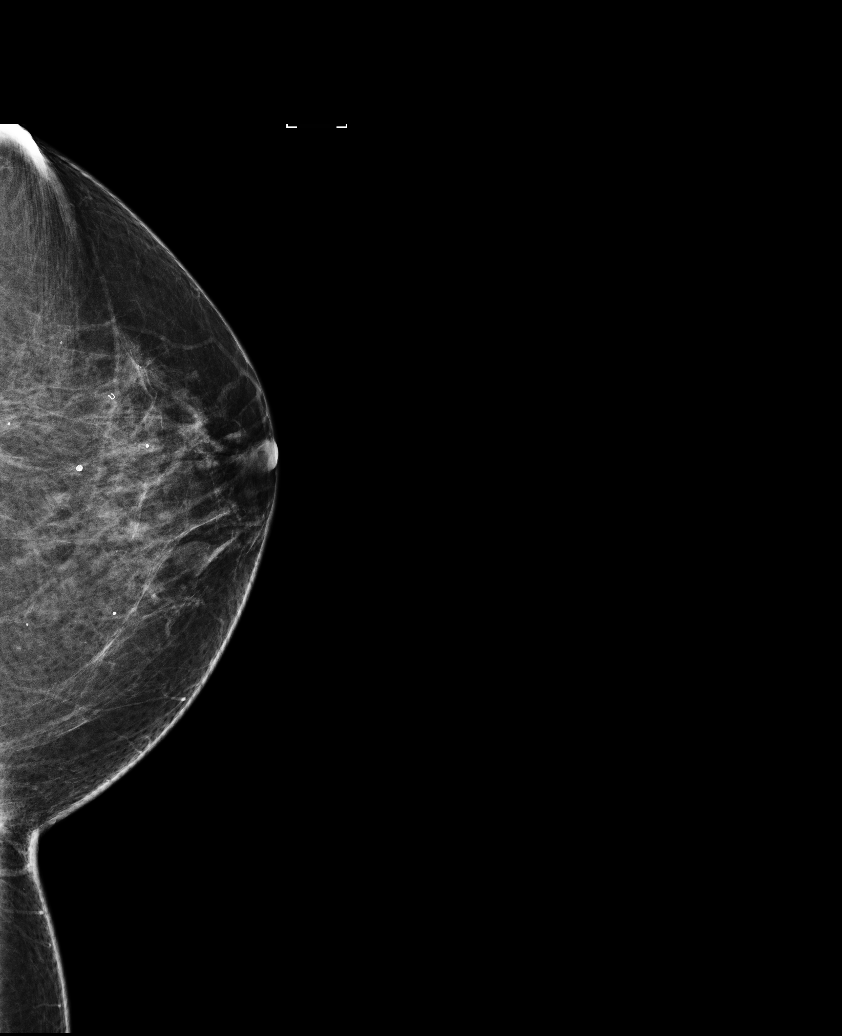

[R MLO]
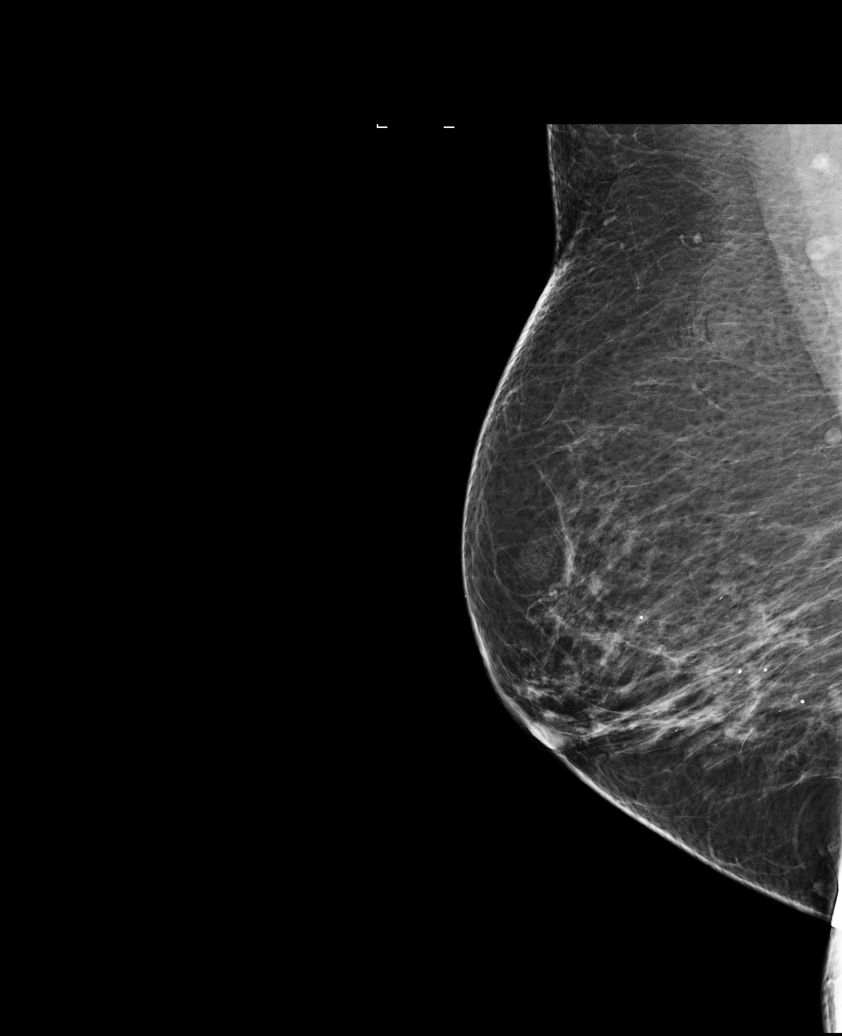

[L CC (2 of 2)]
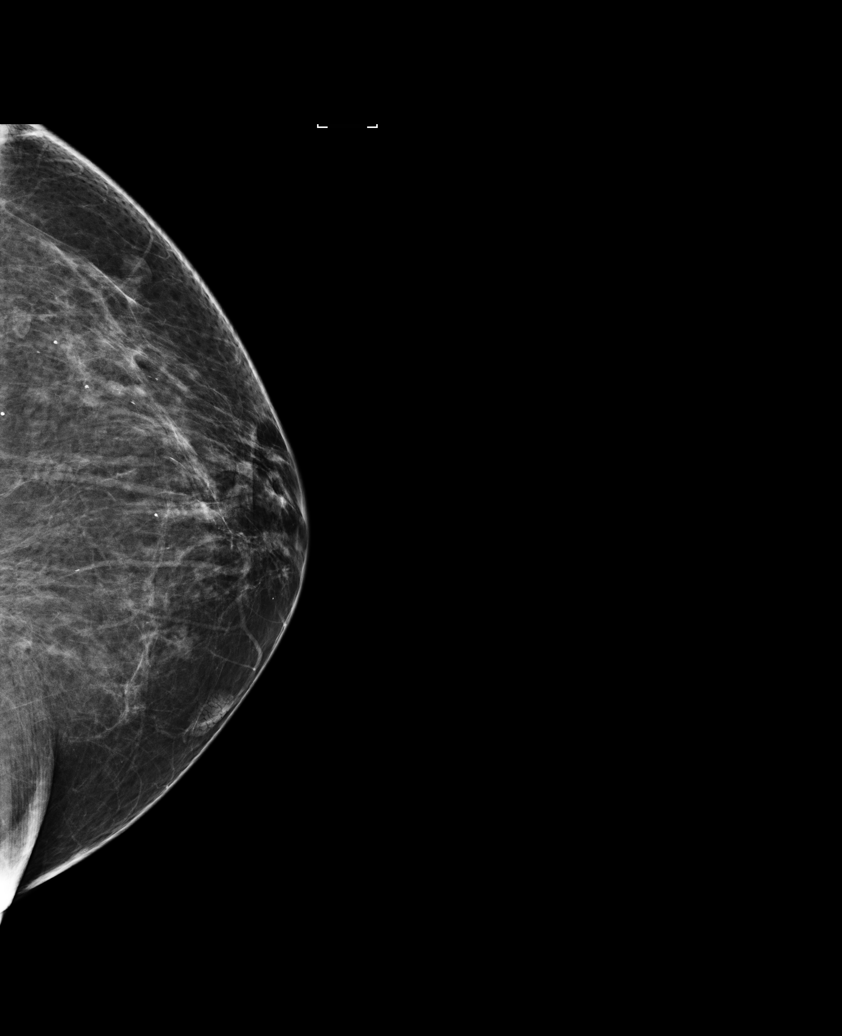

[L MLO]
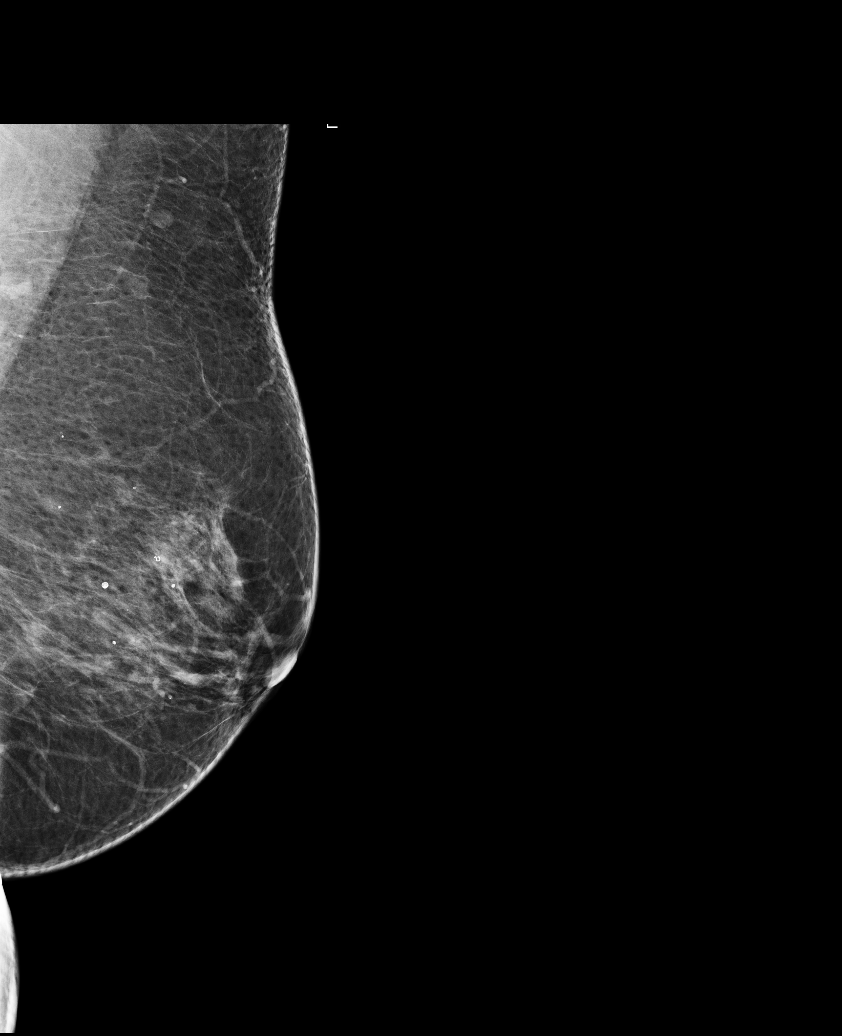

[R CC]
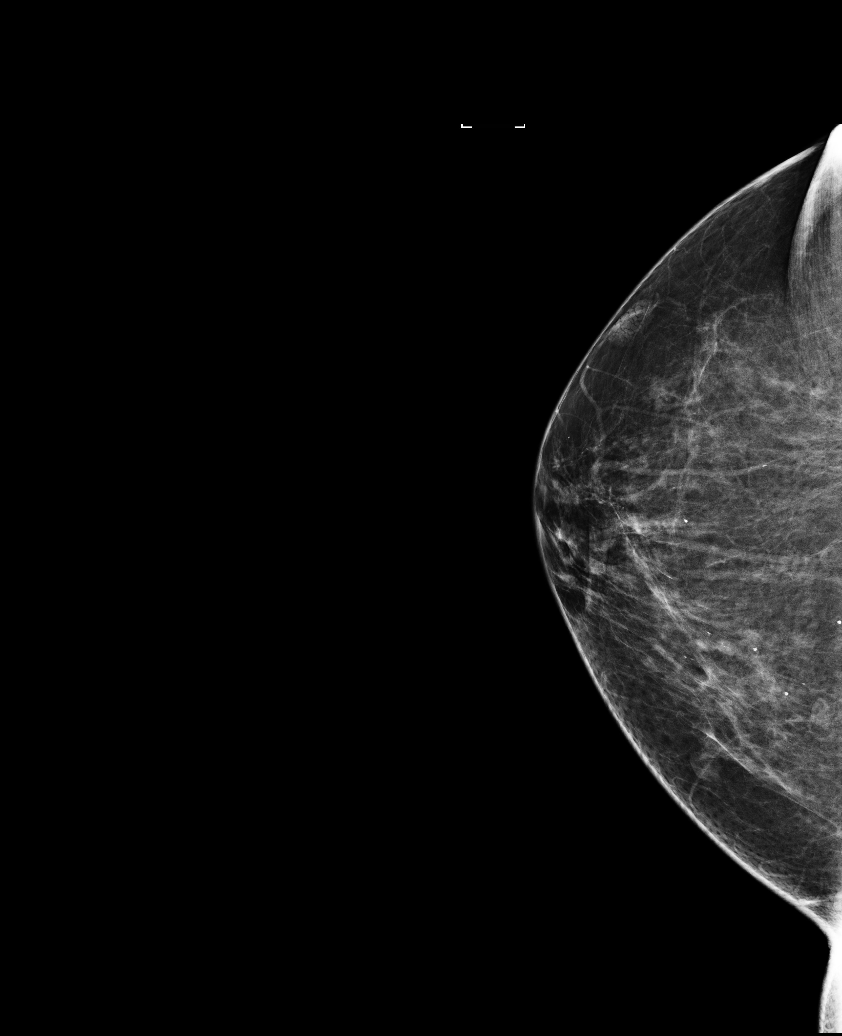

[5 of 5 positions shown; findings below may reference images not displayed]

ACR Breast Density Category b: There are scattered areas of
fibroglandular density.
FINDINGS: There are no findings suspicious for malignancy. Images were
processed with CAD.
IMPRESSION: No mammographic evidence of malignancy. A result letter of this
screening mammogram will be mailed directly to the patient.

RECOMMENDATION:
Screening mammogram in one year. (Code:AS-G-LCT)

BI-RADS CATEGORY  1: Negative.

## 2022-04-04 ENCOUNTER — Other Ambulatory Visit: Payer: Self-pay | Admitting: Internal Medicine

## 2022-05-02 ENCOUNTER — Other Ambulatory Visit: Payer: Self-pay | Admitting: Internal Medicine

## 2022-05-02 DIAGNOSIS — Z1231 Encounter for screening mammogram for malignant neoplasm of breast: Secondary | ICD-10-CM

## 2022-05-07 ENCOUNTER — Ambulatory Visit: Payer: Medicare PPO | Admitting: Internal Medicine

## 2022-05-14 ENCOUNTER — Other Ambulatory Visit: Payer: Self-pay | Admitting: Internal Medicine

## 2022-05-15 ENCOUNTER — Other Ambulatory Visit: Payer: Self-pay | Admitting: Internal Medicine

## 2022-05-15 DIAGNOSIS — I10 Essential (primary) hypertension: Secondary | ICD-10-CM

## 2022-05-16 ENCOUNTER — Ambulatory Visit: Payer: Medicare PPO | Admitting: Internal Medicine

## 2022-05-16 ENCOUNTER — Encounter: Payer: Self-pay | Admitting: Internal Medicine

## 2022-05-16 VITALS — BP 120/80 | HR 70 | Temp 97.7°F | Wt 226.1 lb

## 2022-05-16 DIAGNOSIS — E1169 Type 2 diabetes mellitus with other specified complication: Secondary | ICD-10-CM | POA: Diagnosis not present

## 2022-05-16 DIAGNOSIS — E538 Deficiency of other specified B group vitamins: Secondary | ICD-10-CM

## 2022-05-16 DIAGNOSIS — E119 Type 2 diabetes mellitus without complications: Secondary | ICD-10-CM

## 2022-05-16 DIAGNOSIS — E559 Vitamin D deficiency, unspecified: Secondary | ICD-10-CM

## 2022-05-16 DIAGNOSIS — E785 Hyperlipidemia, unspecified: Secondary | ICD-10-CM | POA: Diagnosis not present

## 2022-05-16 DIAGNOSIS — I10 Essential (primary) hypertension: Secondary | ICD-10-CM | POA: Diagnosis not present

## 2022-05-16 LAB — MICROALBUMIN / CREATININE URINE RATIO
Creatinine,U: 184.1 mg/dL
Microalb Creat Ratio: 1.1 mg/g (ref 0.0–30.0)
Microalb, Ur: 2.1 mg/dL — ABNORMAL HIGH (ref 0.0–1.9)

## 2022-05-16 LAB — COMPREHENSIVE METABOLIC PANEL
ALT: 34 U/L (ref 0–35)
AST: 29 U/L (ref 0–37)
Albumin: 4.3 g/dL (ref 3.5–5.2)
Alkaline Phosphatase: 62 U/L (ref 39–117)
BUN: 17 mg/dL (ref 6–23)
CO2: 28 mEq/L (ref 19–32)
Calcium: 10 mg/dL (ref 8.4–10.5)
Chloride: 101 mEq/L (ref 96–112)
Creatinine, Ser: 1.02 mg/dL (ref 0.40–1.20)
GFR: 52.73 mL/min — ABNORMAL LOW (ref >60.00)
Glucose, Bld: 141 mg/dL — ABNORMAL HIGH (ref 70–99)
Potassium: 4.1 mEq/L (ref 3.5–5.1)
Sodium: 139 mEq/L (ref 135–145)
Total Bilirubin: 0.6 mg/dL (ref 0.2–1.2)
Total Protein: 7.4 g/dL (ref 6.0–8.3)

## 2022-05-16 LAB — LIPID PANEL
Cholesterol: 126 mg/dL (ref 0–200)
HDL: 57 mg/dL (ref >39.00)
LDL Cholesterol: 37 mg/dL (ref 0–99)
NonHDL: 68.77
Total CHOL/HDL Ratio: 2
Triglycerides: 159 mg/dL — ABNORMAL HIGH (ref 0.0–149.0)
VLDL: 31.8 mg/dL (ref 0.0–40.0)

## 2022-05-16 LAB — CBC WITH DIFFERENTIAL/PLATELET
Basophils Absolute: 0 10*3/uL (ref 0.0–0.1)
Basophils Relative: 0.4 % (ref 0.0–3.0)
Eosinophils Absolute: 0.3 10*3/uL (ref 0.0–0.7)
Eosinophils Relative: 4.7 % (ref 0.0–5.0)
HCT: 39.4 % (ref 36.0–46.0)
Hemoglobin: 13.1 g/dL (ref 12.0–15.0)
Lymphocytes Relative: 32 % (ref 12.0–46.0)
Lymphs Abs: 2.4 10*3/uL (ref 0.7–4.0)
MCHC: 33.2 g/dL (ref 30.0–36.0)
MCV: 92.1 fl (ref 78.0–100.0)
Monocytes Absolute: 0.6 10*3/uL (ref 0.1–1.0)
Monocytes Relative: 7.5 % (ref 3.0–12.0)
Neutro Abs: 4.1 10*3/uL (ref 1.4–7.7)
Neutrophils Relative %: 55.4 % (ref 43.0–77.0)
Platelets: 213 10*3/uL (ref 150.0–400.0)
RBC: 4.28 Mil/uL (ref 3.87–5.11)
RDW: 13.6 % (ref 11.5–15.5)
WBC: 7.4 10*3/uL (ref 4.0–10.5)

## 2022-05-16 LAB — VITAMIN D 25 HYDROXY (VIT D DEFICIENCY, FRACTURES): VITD: 23.62 ng/mL — ABNORMAL LOW (ref 30.00–100.00)

## 2022-05-16 LAB — VITAMIN B12: Vitamin B-12: 1099 pg/mL — ABNORMAL HIGH (ref 211–911)

## 2022-05-16 LAB — HEMOGLOBIN A1C: Hgb A1c MFr Bld: 6.8 % — ABNORMAL HIGH (ref 4.6–6.5)

## 2022-05-16 NOTE — Progress Notes (Signed)
Established Patient Office Visit     CC/Reason for Visit: Follow-up chronic medical conditions  HPI: Diane Decker is a 76 y.o. female who is coming in today for the above mentioned reasons. Past Medical History is significant for: Hypertension, hyperlipidemia, morbid obesity, GERD, Barrett's esophagus, type 2 diabetes followed by endocrinology.  She has been doing well and has no acute concerns or complaints.  She is scheduled to see her endocrinologist next month.  She is due for updated labs.  She has recently moved and is in the process of renovating her new house.   Past Medical/Surgical History: Past Medical History:  Diagnosis Date   Allergy    Arthritis    Barrett's esophagus    Cancer ([place])    skin cancer   Chicken pox    Depression    Diabetes mellitus without complication ([place])    Family history of polyps in the colon    GERD (gastroesophageal reflux disease)    Hypertension    Osteopenia    Sleep apnea    borderline- no cpap use     Past Surgical History:  Procedure Laterality Date   ABDOMINAL HYSTERECTOMY     BREAST BIOPSY  2007   COLONOSCOPY     KNEE ARTHROSCOPY Left    ~15 yrs ago    POLYPECTOMY     TONSILLECTOMY  1949   UPPER GASTROINTESTINAL ENDOSCOPY      Social History:  reports that she has quit smoking. She has never used smokeless tobacco. She reports that she does not drink alcohol and does not use drugs.  Allergies: Allergies  Allergen Reactions   Penicillins Anaphylaxis   Whey Protein [Protein] Shortness Of Breath   Lisinopril Other (See Comments) and Swelling   Losartan Potassium Swelling   Losartan Nausea And Vomiting    Family History:  Family History  Problem Relation Age of Onset   Breast cancer Mother    Lung cancer Father    Liver cancer Father    Arthritis Sister    Depression Sister    Hyperlipidemia Sister    Hypertension Sister    Alcohol abuse Brother    Arthritis Brother    Depression Brother     Hyperlipidemia Brother    Hypertension Brother    Cancer Daughter    COPD Daughter    Depression Son    Arthritis Maternal Grandmother    Depression Maternal Grandmother    Hyperlipidemia Maternal Grandmother    Hypertension Maternal Grandmother    Stroke Maternal Grandmother    Alcohol abuse Maternal Grandfather    Arthritis Maternal Grandfather    Cancer Maternal Grandfather    Hyperlipidemia Maternal Grandfather    Hypertension Maternal Grandfather    Stroke Maternal Grandfather    Arthritis Paternal Grandmother    Hyperlipidemia Paternal Grandmother    Hypertension Paternal Grandmother    Arthritis Paternal Grandfather    Alcohol abuse Paternal Grandfather    Diabetes Paternal Grandfather    Hyperlipidemia Paternal Grandfather    Heart disease Paternal Grandfather    Hearing loss Paternal Grandfather    Stroke Paternal Grandfather    Colon polyps Neg Hx    Esophageal cancer Neg Hx    Rectal cancer Neg Hx    Stomach cancer Neg Hx      Current Outpatient Medications:    Accu-Chek Softclix Lancets lancets, Use once daily Dx E11.9, Disp: 100 each, Rfl: 0   aspirin 81 MG chewable tablet, Chew by mouth daily.,  Disp: , Rfl:    Aspirin Buf,CaCarb-MgCarb-MgO, 81 MG TABS, Take by mouth., Disp: , Rfl:    atenolol (TENORMIN) 50 MG tablet, TAKE 1 TABLET BY MOUTH EVERY DAY, Disp: 90 tablet, Rfl: 1   atorvastatin (LIPITOR) 20 MG tablet, Take 1 tablet (20 mg total) by mouth daily., Disp: 90 tablet, Rfl: 1   Blood Glucose Monitoring Suppl (ACCU-CHEK GUIDE ME) w/Device KIT, Use daily for glucose control . Dx E11.9, Disp: 1 kit, Rfl: 0   Calcium Carbonate-Vitamin D (CALCIUM-D PO), Take by mouth. Caltrate plus D, Disp: , Rfl:    cycloSPORINE (RESTASIS) 0.05 % ophthalmic emulsion, 1 drop 2 (two) times daily., Disp: , Rfl:    esomeprazole (NEXIUM) 20 MG capsule, Take 20 mg by mouth every other day., Disp: , Rfl:    glipiZIDE (GLUCOTROL) 5 MG tablet, Take 0.5 tablets (2.5 mg total) by  mouth daily before breakfast., Disp: 45 tablet, Rfl: 3   glucose blood (ACCU-CHEK GUIDE) test strip, Test once daily for glucose control.  Dx E11.9, Disp: 100 each, Rfl: 12   hydrochlorothiazide (HYDRODIURIL) 25 MG tablet, TAKE 1 TABLET (25 MG TOTAL) BY MOUTH DAILY., Disp: 90 tablet, Rfl: 0   metFORMIN (GLUCOPHAGE) 500 MG tablet, Take 2 tablets (1,000 mg total) by mouth 2 (two) times daily with a meal., Disp: 360 tablet, Rfl: 3   Multiple Vitamin (MULTIVITAMIN) capsule, Take by mouth., Disp: , Rfl:    Semaglutide, 1 MG/DOSE, (OZEMPIC, 1 MG/DOSE,) 4 MG/3ML SOPN, Inject 1 mg into the skin once a week., Disp: 9 mL, Rfl: 3   traZODone (DESYREL) 150 MG tablet, TAKE 1/2 TABLET IN THE EVENING, Disp: 45 tablet, Rfl: 1   vitamin E 1000 UNIT capsule, Take 1,000 Units by mouth daily., Disp: , Rfl:   Review of Systems:  Constitutional: Denies fever, chills, diaphoresis, appetite change and fatigue.  HEENT: Denies photophobia, eye pain, redness, hearing loss, ear pain, congestion, sore throat, rhinorrhea, sneezing, mouth sores, trouble swallowing, neck pain, neck stiffness and tinnitus.   Respiratory: Denies SOB, DOE, cough, chest tightness,  and wheezing.   Cardiovascular: Denies chest pain, palpitations and leg swelling.  Gastrointestinal: Denies nausea, vomiting, abdominal pain, diarrhea, constipation, blood in stool and abdominal distention.  Genitourinary: Denies dysuria, urgency, frequency, hematuria, flank pain and difficulty urinating.  Endocrine: Denies: hot or cold intolerance, sweats, changes in hair or nails, polyuria, polydipsia. Musculoskeletal: Denies myalgias, back pain, joint swelling, arthralgias and gait problem.  Skin: Denies pallor, rash and wound.  Neurological: Denies dizziness, seizures, syncope, weakness, light-headedness, numbness and headaches.  Hematological: Denies adenopathy. Easy bruising, personal or family bleeding history  Psychiatric/Behavioral: Denies suicidal ideation,  mood changes, confusion, nervousness, sleep disturbance and agitation    Physical Exam: Vitals:   05/16/22 0931  BP: 120/80  Pulse: 70  Temp: 97.7 F (36.5 C)  TempSrc: Oral  SpO2: 95%  Weight: 226 lb 1.6 oz (102.6 kg)    Body mass index is 38.81 kg/m.   Constitutional: NAD, calm, comfortable Eyes: PERRL, lids and conjunctivae normal, wears corrective lenses ENMT: Mucous membranes are moist.  Respiratory: clear to auscultation bilaterally, no wheezing, no crackles. Normal respiratory effort. No accessory muscle use.  Cardiovascular: Regular rate and rhythm, no murmurs / rubs / gallops. No extremity edema.  Neurologic: Grossly intact and nonfocal Psychiatric: Normal judgment and insight. Alert and oriented x 3. Normal mood.    Impression and Plan:  Type 2 diabetes mellitus without complication, without long-term current use of insulin ([place])  - Plan: CBC  with Differential/Platelet, Comprehensive metabolic panel, Hemoglobin A1c, Microalbumin / creatinine urine ratio, Microalbumin / creatinine urine ratio, Hemoglobin A1c, Comprehensive metabolic panel, CBC with Differential/Platelet -Check microalbumin, she is followed by endocrinology and prefers to have A1c done by them.  Primary hypertension -Blood pressures well controlled on hydrochlorothiazide and atenolol.  Hyperlipidemia associated with type 2 diabetes mellitus ([place])  - Plan: Lipid panel, Lipid panel -She is currently on atorvastatin 20 mg daily.  Vitamin D deficiency  - Plan: VITAMIN D 25 Hydroxy (Vit-D Deficiency, Fractures), VITAMIN D 25 Hydroxy (Vit-D Deficiency, Fractures)  B12 deficiency  - Plan: Vitamin B12, Vitamin B12    Time spent:30 minutes reviewing chart, interviewing and examining patient and formulating plan of care.     Lelon Frohlich, MD [facility]

## 2022-05-17 ENCOUNTER — Other Ambulatory Visit: Payer: Self-pay | Admitting: *Deleted

## 2022-05-17 ENCOUNTER — Other Ambulatory Visit: Payer: Self-pay | Admitting: Internal Medicine

## 2022-05-17 DIAGNOSIS — E559 Vitamin D deficiency, unspecified: Secondary | ICD-10-CM

## 2022-05-17 MED ORDER — VITAMIN D (ERGOCALCIFEROL) 1.25 MG (50000 UNIT) PO CAPS: 50000.0000 [IU] | ORAL_CAPSULE | ORAL | 0 refills | Status: AC

## 2022-05-18 ENCOUNTER — Ambulatory Visit: Payer: Medicare PPO

## 2022-06-05 ENCOUNTER — Ambulatory Visit
Admission: RE | Admit: 2022-06-05 | Discharge: 2022-06-05 | Disposition: A | Discharge status: Home / Self Care | Payer: Medicare PPO | Source: Ambulatory Visit | Attending: Internal Medicine | Admitting: Internal Medicine

## 2022-06-05 DIAGNOSIS — Z1231 Encounter for screening mammogram for malignant neoplasm of breast: Secondary | ICD-10-CM

## 2022-06-18 ENCOUNTER — Ambulatory Visit: Payer: Medicare PPO | Admitting: Internal Medicine

## 2022-06-18 ENCOUNTER — Encounter: Payer: Self-pay | Admitting: Internal Medicine

## 2022-06-18 VITALS — BP 146/88 | HR 78 | Ht 64.0 in | Wt 228.6 lb

## 2022-06-18 DIAGNOSIS — E119 Type 2 diabetes mellitus without complications: Secondary | ICD-10-CM | POA: Diagnosis not present

## 2022-06-18 LAB — POCT GLUCOSE (DEVICE FOR HOME USE): POC Glucose: 188 mg/dl — AB (ref 70–99)

## 2022-06-18 NOTE — Patient Instructions (Addendum)
-   Continue  Ozempic 1 mg once weekly  - Continue Glipizide 5 mg, Half a  tablet before Breakfast  - Continue Metformin 500 mg , 2 tablets with Breakfast and 2 tablet with supper     HOW TO TREAT LOW BLOOD SUGARS (Blood sugar LESS THAN 70 MG/DL) Please follow the RULE OF 15 for the treatment of hypoglycemia treatment (when your (blood sugars are less than 70 mg/dL)   STEP 1: Take 15 grams of carbohydrates when your blood sugar is low, which includes:  3-4 GLUCOSE TABS  OR 3-4 OZ OF JUICE OR REGULAR SODA OR ONE TUBE OF GLUCOSE GEL    STEP 2: RECHECK blood sugar in 15 MINUTES STEP 3: If your blood sugar is still low at the 15 minute recheck --> then, go back to STEP 1 and treat AGAIN with another 15 grams of carbohydrates.

## 2022-06-18 NOTE — Progress Notes (Unsigned)
Name: Diane Decker  Age/ Sex: 76 y.o., female   MRN/ DOB: [id number], 1946/01/10     PCP: Isaac Bliss, Rayford Halsted, MD   Reason for Endocrinology Evaluation: Type 2 Diabetes Mellitus  Initial Endocrine Consultative Visit: 11/10/2020    PATIENT IDENTIFIER: [name] is a 76 y.o. female with a past medical history of T2DM, barrett's esophagus. The patient has followed with Endocrinology clinic since 11/10/2020 for consultative assistance with management of her diabetes.  DIABETIC HISTORY:  [name] was diagnosed with DM at age 8, ozempic caused GI side effects. . Her hemoglobin A1c has ranged from 6.6%  in 2021, peaking at 9.7 %in 2021.    On her initial visit to our clinic her A1c was 6.6 % she was on Glipizide XL, Metformin and wanted to give ozempic another try. We swiched XL glipizide to regular release     SUBJECTIVE:   During the last visit (06/15/2021): A1c 6.7% continued  Ozempic, Glipizide and Metformin     Today (06/18/2022): [name]  is here for a follow up on diabetes management.  She  has not been checking glucose as much, she moved to a new place.   She feel lethargic after the Ozempic but this lasts one day Denies nausea or vomiting , has rare diarrhea with fruits   HOME DIABETES REGIMEN:  Glipizide 5 mg,half a tablet before Breakfast  Metformin 500 mg , 2 tablets with Breakfast and 2 tablet with supper  Ozempic 1 mg once weekly ( Tuesday)    Statin: yes  ACE-I/ARB: Intolerant to lisinopril and losartan    METER DOWNLOAD SUMMARY: Forgot meter today   DIABETIC COMPLICATIONS: Microvascular complications:   Denies: CKD, retinopathy, neuropathy  Last Eye Exam: Completed 10/2021  Macrovascular complications:   Denies: CAD, CVA, PVD   HISTORY:  Past Medical History:  Past Medical History:  Diagnosis Date   Allergy    Arthritis    Barrett's esophagus    Cancer ([place])    skin cancer   Chicken pox     Depression    Diabetes mellitus without complication ([place])    Family history of polyps in the colon    GERD (gastroesophageal reflux disease)    Hypertension    Osteopenia    Sleep apnea    borderline- no cpap use    Past Surgical History:  Past Surgical History:  Procedure Laterality Date   ABDOMINAL HYSTERECTOMY     BREAST BIOPSY  2007   COLONOSCOPY     KNEE ARTHROSCOPY Left    ~15 yrs ago    POLYPECTOMY     TONSILLECTOMY  1949   UPPER GASTROINTESTINAL ENDOSCOPY     Social History:  reports that she has quit smoking. She has never used smokeless tobacco. She reports that she does not drink alcohol and does not use drugs. Family History:  Family History  Problem Relation Age of Onset   Breast cancer Mother    Lung cancer Father    Liver cancer Father    Arthritis Sister    Depression Sister    Hyperlipidemia Sister    Hypertension Sister    Alcohol abuse Brother    Arthritis Brother    Depression Brother    Hyperlipidemia Brother    Hypertension Brother    Cancer Daughter    COPD Daughter    Depression Son    Arthritis Maternal Grandmother    Depression Maternal Grandmother    Hyperlipidemia Maternal Grandmother  Hypertension Maternal Grandmother    Stroke Maternal Grandmother    Alcohol abuse Maternal Grandfather    Arthritis Maternal Grandfather    Cancer Maternal Grandfather    Hyperlipidemia Maternal Grandfather    Hypertension Maternal Grandfather    Stroke Maternal Grandfather    Arthritis Paternal Grandmother    Hyperlipidemia Paternal Grandmother    Hypertension Paternal Grandmother    Arthritis Paternal Grandfather    Alcohol abuse Paternal Grandfather    Diabetes Paternal Grandfather    Hyperlipidemia Paternal Grandfather    Heart disease Paternal Grandfather    Hearing loss Paternal Grandfather    Stroke Paternal Grandfather    Colon polyps Neg Hx    Esophageal cancer Neg Hx    Rectal cancer Neg Hx    Stomach cancer Neg Hx      HOME  MEDICATIONS: Allergies as of 06/18/2022       Reactions   Penicillins Anaphylaxis   Whey Protein [protein] Shortness Of Breath   Lisinopril Other (See Comments), Swelling   Losartan Potassium Swelling   Losartan Nausea And Vomiting        Medication List        Accurate as of June 18, 2022  2:13 PM. If you have any questions, ask your nurse or doctor.          Accu-Chek Guide Me w/Device Kit Use daily for glucose control . Dx E11.9   Accu-Chek Guide test strip Generic drug: glucose blood Test once daily for glucose control.  Dx E11.9   Accu-Chek Softclix Lancets lancets Use once daily Dx E11.9   aspirin 81 MG chewable tablet Chew by mouth daily.   Aspirin Buf(CaCarb-MgCarb-MgO) 81 MG Tabs Take by mouth.   atenolol 50 MG tablet Commonly known as: TENORMIN TAKE 1 TABLET BY MOUTH EVERY DAY   atorvastatin 20 MG tablet Commonly known as: LIPITOR Take 1 tablet (20 mg total) by mouth daily.   CALCIUM-D PO Take by mouth. Caltrate plus D   cycloSPORINE 0.05 % ophthalmic emulsion Commonly known as: RESTASIS 1 drop 2 (two) times daily.   esomeprazole 20 MG capsule Commonly known as: NEXIUM Take 20 mg by mouth every other day.   glipiZIDE 5 MG tablet Commonly known as: GLUCOTROL Take 0.5 tablets (2.5 mg total) by mouth daily before breakfast.   hydrochlorothiazide 25 MG tablet Commonly known as: HYDRODIURIL TAKE 1 TABLET (25 MG TOTAL) BY MOUTH DAILY.   metFORMIN 500 MG tablet Commonly known as: GLUCOPHAGE Take 2 tablets (1,000 mg total) by mouth 2 (two) times daily with a meal.   multivitamin capsule Take by mouth.   Ozempic (1 MG/DOSE) 4 MG/3ML Sopn Generic drug: Semaglutide (1 MG/DOSE) Inject 1 mg into the skin once a week.   traZODone 150 MG tablet Commonly known as: DESYREL TAKE 1/2 TABLET IN THE EVENING   Vitamin D (Ergocalciferol) 1.25 MG (50000 UNIT) Caps capsule Commonly known as: DRISDOL Take 1 capsule (50,000 Units total) by mouth  every 7 (seven) days for 12 doses.   vitamin E 1000 UNIT capsule Take 1,000 Units by mouth daily.         OBJECTIVE:   Vital Signs: BP (!) 146/88 (BP Location: Left Arm, Patient Position: Sitting, Cuff Size: Small)   Pulse 78   Ht 5' 4" (1.626 m)   Wt 228 lb 9.6 oz (103.7 kg)   SpO2 95%   BMI 39.24 kg/m   Wt Readings from Last 3 Encounters:  06/18/22 228 lb 9.6 oz (103.7 kg)  05/16/22 226 lb 1.6 oz (102.6 kg)  12/28/21 227 lb (103 kg)     Exam: General: Pt appears well and is in NAD  Lungs: Clear with good BS bilat   Heart: RRR   Extremities: No pretibial edema.   Neuro: MS is good with appropriate affect, pt is alert and Ox3   DM foot exam: 12/18/2021   The skin of the feet is intact without sores or ulcerations. The pedal pulses are 2+ on right and 2+ on left. The sensation is intact to a screening 5.07, 10 gram monofilament bilaterally     DATA REVIEWED:  Lab Results  Component Value Date   HGBA1C 6.8 (H) 05/16/2022   HGBA1C 6.9 11/06/2021   HGBA1C 6.7 (A) 06/15/2021   Lab Results  Component Value Date   MICROALBUR 2.1 (H) 05/16/2022   LDLCALC 37 05/16/2022   CREATININE 1.02 05/16/2022    Lab Results  Component Value Date   CHOL 126 05/16/2022   HDL 57.00 05/16/2022   LDLCALC 37 05/16/2022   TRIG 159.0 (H) 05/16/2022   CHOLHDL 2 05/16/2022        Latest Reference Range & Units 05/16/22 10:09  Sodium 135 - 145 mEq/L 139  Potassium 3.5 - 5.1 mEq/L 4.1  Chloride 96 - 112 mEq/L 101  CO2 19 - 32 mEq/L 28  Glucose 70 - 99 mg/dL 141 (H)  BUN 6 - 23 mg/dL 17  Creatinine 0.40 - 1.20 mg/dL 1.02  Calcium 8.4 - 10.5 mg/dL 10.0  Alkaline Phosphatase 39 - 117 U/L 62  Albumin 3.5 - 5.2 g/dL 4.3  AST 0 - 37 U/L 29  ALT 0 - 35 U/L 34  Total Protein 6.0 - 8.3 g/dL 7.4  Total Bilirubin 0.2 - 1.2 mg/dL 0.6  GFR >60.00 mL/min 52.73 (L)    ASSESSMENT / PLAN / RECOMMENDATIONS:   1) Type 2 Diabetes Mellitus, Optimally controlled, Without complications -  Most recent A1c of 6.8  %. Goal A1c < 7.0 %.    -A1c continues to be at goal -No side effects to Ozempic   MEDICATIONS: Continue glipizide 5 mg, half a tablet before breakfast Continue metformin 500 mg, 2 tablets twice daily Continue Ozempic  1 mg weekly  EDUCATION / INSTRUCTIONS: BG monitoring instructions: Patient is instructed to check her blood sugars 1 times a day, fsting Call [facility] if: BG persistently < 70  I reviewed the Rule of 15 for the treatment of hypoglycemia in detail with the patient. Literature supplied.   2) Diabetic complications:  Eye: Does not have known diabetic retinopathy.  Neuro/ Feet: Does not have known diabetic peripheral neuropathy .  Renal: Patient does not have known baseline CKD. She   is intolerant to  ACEI/ARB    3) Dyslipidemia :  - LDL at goal of 42 mg/dL    Medication  Continue Atorvastatin 20 mg daily      F/U in 6 months   Signed electronically by: Mack Guise, MD  [facility]  [address] [place], [place] [address] Phone: [phone] FAX: [phone]   CC: Isaac Bliss, Rayford Halsted, MD [place] [address] Phone: [phone]  Fax: [phone]  Return to Endocrinology clinic as below: No future appointments.

## 2022-06-29 ENCOUNTER — Other Ambulatory Visit: Payer: Self-pay | Admitting: Internal Medicine

## 2022-06-29 DIAGNOSIS — E1165 Type 2 diabetes mellitus with hyperglycemia: Secondary | ICD-10-CM

## 2022-07-23 ENCOUNTER — Other Ambulatory Visit: Payer: Self-pay | Admitting: Internal Medicine

## 2022-07-23 DIAGNOSIS — E559 Vitamin D deficiency, unspecified: Secondary | ICD-10-CM

## 2022-07-29 ENCOUNTER — Other Ambulatory Visit: Payer: Self-pay | Admitting: Internal Medicine

## 2022-09-19 DIAGNOSIS — H5203 Hypermetropia, bilateral: Secondary | ICD-10-CM | POA: Diagnosis not present

## 2022-09-19 DIAGNOSIS — H2513 Age-related nuclear cataract, bilateral: Secondary | ICD-10-CM | POA: Diagnosis not present

## 2022-09-19 DIAGNOSIS — E119 Type 2 diabetes mellitus without complications: Secondary | ICD-10-CM | POA: Diagnosis not present

## 2022-09-19 LAB — HM DIABETES EYE EXAM

## 2022-09-20 ENCOUNTER — Encounter: Payer: Self-pay | Admitting: Internal Medicine

## 2022-09-27 ENCOUNTER — Other Ambulatory Visit: Payer: Self-pay | Admitting: Internal Medicine

## 2022-09-27 DIAGNOSIS — I10 Essential (primary) hypertension: Secondary | ICD-10-CM

## 2022-10-04 DIAGNOSIS — H2511 Age-related nuclear cataract, right eye: Secondary | ICD-10-CM | POA: Diagnosis not present

## 2022-10-04 DIAGNOSIS — H269 Unspecified cataract: Secondary | ICD-10-CM | POA: Diagnosis not present

## 2022-10-28 ENCOUNTER — Other Ambulatory Visit: Payer: Self-pay | Admitting: Internal Medicine

## 2022-10-29 ENCOUNTER — Other Ambulatory Visit: Payer: Self-pay | Admitting: Internal Medicine

## 2022-10-29 DIAGNOSIS — E1165 Type 2 diabetes mellitus with hyperglycemia: Secondary | ICD-10-CM

## 2022-11-22 ENCOUNTER — Ambulatory Visit: Payer: Medicare PPO | Admitting: Internal Medicine

## 2022-11-22 ENCOUNTER — Encounter: Payer: Self-pay | Admitting: Internal Medicine

## 2022-11-22 VITALS — BP 130/80 | HR 79 | Temp 98.2°F | Wt 228.2 lb

## 2022-11-22 DIAGNOSIS — E119 Type 2 diabetes mellitus without complications: Secondary | ICD-10-CM | POA: Diagnosis not present

## 2022-11-22 DIAGNOSIS — M19011 Primary osteoarthritis, right shoulder: Secondary | ICD-10-CM | POA: Diagnosis not present

## 2022-11-22 MED ORDER — FREESTYLE LIBRE 3 SENSOR MISC: 1.0000 | 2 refills | Status: DC

## 2022-11-22 NOTE — Progress Notes (Signed)
Established Patient Office Visit     CC/Reason for Visit: Right shoulder pain  HPI: Diane Decker is a 77 y.o. female who is coming in today for the above mentioned reasons.  She is known to have right shoulder arthritis.  When she lived in [place] she had an orthopedist.  She last had an intra-articular steroid about 3 years ago.  She has been doing well until now.  She is having pain and difficulty raising her shoulder above neck height.  She is requesting another blood glucose meter.   Past Medical/Surgical History: Past Medical History:  Diagnosis Date   Allergy    Arthritis    Barrett's esophagus    Cancer ([place])    skin cancer   Chicken pox    Depression    Diabetes mellitus without complication (Diane Decker)    Family history of polyps in the colon    GERD (gastroesophageal reflux disease)    Hypertension    Osteopenia    Sleep apnea    borderline- no cpap use     Past Surgical History:  Procedure Laterality Date   ABDOMINAL HYSTERECTOMY     BREAST BIOPSY  2007   COLONOSCOPY     KNEE ARTHROSCOPY Left    ~15 yrs ago    POLYPECTOMY     TONSILLECTOMY  1949   UPPER GASTROINTESTINAL ENDOSCOPY      Social History:  reports that she has quit smoking. She has never used smokeless tobacco. She reports that she does not drink alcohol and does not use drugs.  Allergies: Allergies  Allergen Reactions   Penicillins Anaphylaxis   Whey Protein [Protein] Shortness Of Breath   Lisinopril Other (See Comments) and Swelling   Losartan Potassium Swelling   Losartan Nausea And Vomiting    Family History:  Family History  Problem Relation Age of Onset   Breast cancer Mother    Lung cancer Father    Liver cancer Father    Arthritis Sister    Depression Sister    Hyperlipidemia Sister    Hypertension Sister    Alcohol abuse Brother    Arthritis Brother    Depression Brother    Hyperlipidemia Brother    Hypertension Brother    Cancer Daughter    COPD  Daughter    Depression Son    Arthritis Maternal Grandmother    Depression Maternal Grandmother    Hyperlipidemia Maternal Grandmother    Hypertension Maternal Grandmother    Stroke Maternal Grandmother    Alcohol abuse Maternal Grandfather    Arthritis Maternal Grandfather    Cancer Maternal Grandfather    Hyperlipidemia Maternal Grandfather    Hypertension Maternal Grandfather    Stroke Maternal Grandfather    Arthritis Paternal Grandmother    Hyperlipidemia Paternal Grandmother    Hypertension Paternal Grandmother    Arthritis Paternal Grandfather    Alcohol abuse Paternal Grandfather    Diabetes Paternal Grandfather    Hyperlipidemia Paternal Grandfather    Heart disease Paternal Grandfather    Hearing loss Paternal Grandfather    Stroke Paternal Grandfather    Colon polyps Neg Hx    Esophageal cancer Neg Hx    Rectal cancer Neg Hx    Stomach cancer Neg Hx      Current Outpatient Medications:    Accu-Chek Softclix Lancets lancets, Use once daily Dx E11.9, Disp: 100 each, Rfl: 0   aspirin 81 MG chewable tablet, Chew by mouth daily., Disp: , Rfl:  Aspirin Buf,CaCarb-MgCarb-MgO, 81 MG TABS, Take by mouth., Disp: , Rfl:    atenolol (TENORMIN) 50 MG tablet, TAKE 1 TABLET BY MOUTH EVERY DAY, Disp: 90 tablet, Rfl: 1   atorvastatin (LIPITOR) 20 MG tablet, TAKE 1 TABLET BY MOUTH EVERY DAY, Disp: 90 tablet, Rfl: 1   Blood Glucose Monitoring Suppl (ACCU-CHEK GUIDE ME) w/Device KIT, Use daily for glucose control . Dx E11.9, Disp: 1 kit, Rfl: 0   Calcium Carbonate-Vitamin D (CALCIUM-D PO), Take by mouth. Caltrate plus D, Disp: , Rfl:    Continuous Blood Gluc Sensor (FREESTYLE LIBRE 3 SENSOR) MISC, 1 each by Does not apply route every 14 (fourteen) days., Disp: 2 each, Rfl: 2   cycloSPORINE (RESTASIS) 0.05 % ophthalmic emulsion, 1 drop 2 (two) times daily., Disp: , Rfl:    esomeprazole (NEXIUM) 20 MG capsule, Take 20 mg by mouth every other day., Disp: , Rfl:    glipiZIDE (GLUCOTROL)  5 MG tablet, Take 0.5 tablets (2.5 mg total) by mouth daily before breakfast., Disp: 45 tablet, Rfl: 3   glucose blood (ACCU-CHEK GUIDE) test strip, Test once daily for glucose control.  Dx E11.9, Disp: 100 each, Rfl: 12   hydrochlorothiazide (HYDRODIURIL) 25 MG tablet, TAKE 1 TABLET (25 MG TOTAL) BY MOUTH DAILY., Disp: 90 tablet, Rfl: 1   metFORMIN (GLUCOPHAGE) 500 MG tablet, TAKE 2 TABLETS (1,000 MG TOTAL) BY MOUTH 2 (TWO) TIMES DAILY WITH A MEAL., Disp: 360 tablet, Rfl: 1   Multiple Vitamin (MULTIVITAMIN) capsule, Take by mouth., Disp: , Rfl:    Semaglutide, 1 MG/DOSE, (OZEMPIC, 1 MG/DOSE,) 4 MG/3ML SOPN, Inject 1 mg into the skin once a week., Disp: 9 mL, Rfl: 3   traZODone (DESYREL) 150 MG tablet, TAKE 1/2 TABLET IN THE EVENING, Disp: 45 tablet, Rfl: 1   vitamin E 1000 UNIT capsule, Take 1,000 Units by mouth daily., Disp: , Rfl:   Review of Systems:  Negative unless indicated in HPI.   Physical Exam: Vitals:   11/22/22 1358  BP: 130/80  Pulse: 79  Temp: 98.2 F (36.8 C)  TempSrc: Oral  SpO2: 99%  Weight: 228 lb 3.2 oz (103.5 kg)    Body mass index is 39.17 kg/m.   Physical Exam Vitals reviewed.  Constitutional:      Appearance: Normal appearance.  HENT:     Head: Normocephalic and atraumatic.  Eyes:     Conjunctiva/sclera: Conjunctivae normal.     Pupils: Pupils are equal, round, and reactive to light.  Skin:    General: Skin is warm and dry.  Neurological:     General: No focal deficit present.     Mental Status: She is alert and oriented to person, place, and time.  Psychiatric:        Mood and Affect: Mood normal.        Behavior: Behavior normal.        Thought Content: Thought content normal.        Judgment: Judgment normal.      Impression and Plan:  Arthritis of right shoulder region - Plan: Ambulatory referral to Sports Medicine  Type 2 diabetes mellitus without complication, without long-term current use of insulin ([name]) - Plan: Continuous Blood  Gluc Sensor (FREESTYLE LIBRE 3 SENSOR) MISC  -Refer to sports medicine, sounds like she might be due for another intra-articular steroid, would also likely benefit from an exercise regimen. -After discussion about ways to monitor blood glucose, we have discussed freestyle libre.  I have helped her download application to her  phone, samples from office provided and prescription sent.  Time spent:31 minutes reviewing chart, interviewing and examining patient and formulating plan of care.     Lelon Frohlich, MD [facility] at [facility]

## 2022-11-27 ENCOUNTER — Ambulatory Visit: Payer: Self-pay

## 2022-11-27 ENCOUNTER — Ambulatory Visit: Payer: Medicare PPO | Admitting: Family Medicine

## 2022-11-27 ENCOUNTER — Ambulatory Visit (INDEPENDENT_AMBULATORY_CARE_PROVIDER_SITE_OTHER): Payer: Medicare PPO

## 2022-11-27 VITALS — BP 130/82 | HR 73 | Ht 64.0 in | Wt 229.0 lb

## 2022-11-27 DIAGNOSIS — M25511 Pain in right shoulder: Secondary | ICD-10-CM

## 2022-11-27 DIAGNOSIS — G8929 Other chronic pain: Secondary | ICD-10-CM | POA: Diagnosis not present

## 2022-11-27 DIAGNOSIS — M19011 Primary osteoarthritis, right shoulder: Secondary | ICD-10-CM | POA: Diagnosis not present

## 2022-11-27 NOTE — Progress Notes (Signed)
I, Peterson Lombard, LAT, ATC acting as a scribe for Lynne Leader, MD.  Subjective:    CC: R shoulder pain  HPI: Pt is a 77 y/o female c/o R shoulder pain that's chronic in nature. Pt last had a R shoulder steroid injection about 3.5 years ago. Pt recently move to [place] from [facility]. Pt reports her R shoulder started hurting again over during the fall when she was planting pansies. Pt locates pain to all over R GH joint w/ radiating pain into the R upper arm.   Radiates: yes Aggravates: overhead, shoulder flexion, aBd, IR Treatments tried: prior steroid injections, IBU  Pertinent review of Systems: No fevers or chills  Relevant historical information: Hypertension.  Diabetes.   Objective:    Vitals:   11/27/22 1356  BP: 130/82  Pulse: 73  SpO2: 97%   General: Well Developed, well nourished, and in no acute distress.   MSK: Right shoulder: Normal appearing Decreased range of motion.  Abduction limited to 120 degrees.  External rotation full internal rotation lumbar spine. Strength abduction 4/5 external rotation 4/5 internal rotation 5/5. Positive Hawkins and Neer's test.  Positive empty can test. Negative Yergason's and speeds test. Pulses cap refill and sensation are intact distally.  Lab and Radiology Results  Procedure: Real-time Ultrasound Guided Injection of right shoulder subacromial bursa Device: Philips Affiniti 50G Images permanently stored and available for review in PACS Ultrasound evaluation prior to injection reveals large hyperechoic calcific change superficial to subscapularis tendon.  Large subacromial and subdeltoid bursitis.  AC DJD. Verbal informed consent obtained.  Discussed risks and benefits of procedure. Warned about infection, bleeding, hyperglycemia damage to structures among others. Patient expresses understanding and agreement Time-out conducted.   Noted no overlying erythema, induration, or other signs of local infection.   Skin  prepped in a sterile fashion.   Local anesthesia: Topical Ethyl chloride.   With sterile technique and under real time ultrasound guidance: 40 mg of Kenalog and 2 mL of Marcaine injected into subacromial bursa. Fluid seen entering the bursa.   Completed without difficulty   Pain immediately resolved suggesting accurate placement of the medication.   Advised to call if fevers/chills, erythema, induration, drainage, or persistent bleeding.   Images permanently stored and available for review in the ultrasound unit.  Impression: Technically successful ultrasound guided injection.   X-ray images right shoulder obtained today personally and independently interpreted Calcific loose bodies present anterior to the humeral head on x-ray. AC DJD is present.  No severe glenohumeral DJD is present.  No acute fractures are visible. Await formal radiology review     Impression and Recommendations:    Assessment and Plan: 77 y.o. female with right shoulder pain.  Patient has an acute exacerbation of chronic shoulder pain.  She has had subacromial injections in the past.  Differential on physical exam today is most consistent with subacromial bursitis.  Ultrasound and x-ray show bursitis but she also has these calcific loose bodies on x-ray that I am not sure if are clinically significant or not.  Plan for subacromial injection today and recheck in a month.Marland Kitchen  PDMP not reviewed this encounter. Orders Placed This Encounter  Procedures   [place] LIMITED JOINT SPACE STRUCTURES UP RIGHT(NO LINKED CHARGES)    Order Specific Question:   Reason for Exam (SYMPTOM  OR DIAGNOSIS REQUIRED)    Answer:   right shoulder pain    Order Specific Question:   Preferred imaging location?    Answer:   [facility] Shoulder Right    Standing Status:   Future    Number of Occurrences:   1    Standing Expiration Date:   11/28/2023    Order Specific Question:   Reason for Exam (SYMPTOM  OR DIAGNOSIS  REQUIRED)    Answer:   right shoulder pain    Order Specific Question:   Preferred imaging location?    Answer:   [facility]   No orders of the defined types were placed in this encounter.   Discussed warning signs or symptoms. Please see discharge instructions. Patient expresses understanding.   The above documentation has been reviewed and is accurate and complete Lynne Leader, M.D.

## 2022-11-27 NOTE — Patient Instructions (Addendum)
Thank you for coming in today.   Please get an Xray today before you leave   You received an injection today. Seek immediate medical attention if the joint becomes red, extremely painful, or is oozing fluid.   Check back in 1 month

## 2022-11-29 DIAGNOSIS — H269 Unspecified cataract: Secondary | ICD-10-CM | POA: Diagnosis not present

## 2022-11-29 DIAGNOSIS — H268 Other specified cataract: Secondary | ICD-10-CM | POA: Diagnosis not present

## 2022-11-29 DIAGNOSIS — H2512 Age-related nuclear cataract, left eye: Secondary | ICD-10-CM | POA: Diagnosis not present

## 2022-11-29 LAB — HM DIABETES EYE EXAM

## 2022-11-29 NOTE — Progress Notes (Signed)
Right shoulder x-ray shows a little bit of arthritis of the small joint in the shoulder the [facility] joint.  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  These I think could be getting in the way and causing some problems.  If you are able to get your shoulder come down with injections that may be treatment enough and we could potentially avoid surgery.  However if your shoulder does not calm down I think it may be helpful to get you to a surgeon to potentially talk about removing this.  I do think seeing how it does after an injection is worthwhile.  You are scheduled to see me in about a month.  Will look at the x-ray then and talk about how your shoulder feels.

## 2022-12-19 ENCOUNTER — Encounter: Payer: Self-pay | Admitting: Internal Medicine

## 2022-12-19 ENCOUNTER — Ambulatory Visit: Payer: Medicare PPO | Admitting: Internal Medicine

## 2022-12-19 VITALS — BP 124/82 | HR 84 | Ht 64.0 in | Wt 225.0 lb

## 2022-12-19 DIAGNOSIS — E119 Type 2 diabetes mellitus without complications: Secondary | ICD-10-CM

## 2022-12-19 DIAGNOSIS — E1165 Type 2 diabetes mellitus with hyperglycemia: Secondary | ICD-10-CM

## 2022-12-19 LAB — POCT GLYCOSYLATED HEMOGLOBIN (HGB A1C): Hemoglobin A1C: 7.2 % — AB (ref 4.0–5.6)

## 2022-12-19 MED ORDER — METFORMIN HCL 500 MG PO TABS: 1000.0000 mg | ORAL_TABLET | Freq: Two times a day (BID) | ORAL | 3 refills | Status: DC

## 2022-12-19 MED ORDER — OZEMPIC (1 MG/DOSE) 4 MG/3ML ~~LOC~~ SOPN: 1.0000 mg | PEN_INJECTOR | SUBCUTANEOUS | 3 refills | Status: DC

## 2022-12-19 MED ORDER — GLIPIZIDE 5 MG PO TABS: 2.5000 mg | ORAL_TABLET | Freq: Two times a day (BID) | ORAL | 3 refills | Status: DC

## 2022-12-19 NOTE — Progress Notes (Signed)
Name: Diane Decker  Age/ Sex: 77 y.o., female   MRN/ DOB: [id number], [birth year]     PCP: Isaac Bliss, Rayford Halsted, MD   Reason for Endocrinology Evaluation: Type 2 Diabetes Mellitus  Initial Endocrine Consultative Visit: 11/10/2020    PATIENT IDENTIFIER: [name] is a 77 y.o. female with a past medical history of T2DM, barrett's esophagus. The patient has followed with Endocrinology clinic since 11/10/2020 for consultative assistance with management of her diabetes.  DIABETIC HISTORY:  Ms. Roache was diagnosed with DM at age 42, ozempic caused GI side effects. . Her hemoglobin A1c has ranged from 6.6%  in 2021, peaking at 9.7 %in 2021.    On her initial visit to our clinic her A1c was 6.6 % she was on Glipizide XL, Metformin and wanted to give ozempic another try. We swiched XL glipizide to regular release     SUBJECTIVE:   During the last visit (06/18/2022): A1c 6.8%     Today (12/19/2022): [name]  is here for a follow up on diabetes management.  She  has not been checking glucose as much, she moved to a new place.   She had two cataract removed since her last visit here  Had a crown removed as well  She continues with fatigue  She was started on freestyle libre Denies nausea or vomiting   HOME DIABETES REGIMEN:  Glipizide 5 mg,half a tablet before Breakfast  Metformin 500 mg , 2 tablets with Breakfast and 2 tablet with supper  Ozempic 1 mg once weekly ( Tuesday)    Statin: yes  ACE-I/ARB: Intolerant to lisinopril and losartan    CONTINUOUS GLUCOSE MONITORING RECORD INTERPRETATION    Dates of Recording: 2/2-2/15/2024  Sensor description:freestyle  Results statistics:   CGM use % of time 97  Average and SD 156/33.4  Time in range     78   %  % Time Above 180 15  % Time above 250 7  % Time Below target 0   Glycemic patterns summary: BG's optimal overnight and most of the day   Hyperglycemic episodes   postprandial   Hypoglycemic episodes occurred n/a  Overnight periods: optimal     DIABETIC COMPLICATIONS: Microvascular complications:   Denies: CKD, retinopathy, neuropathy  Last Eye Exam: Completed 10/2021  Macrovascular complications:   Denies: CAD, CVA, PVD   HISTORY:  Past Medical History:  Past Medical History:  Diagnosis Date   Allergy    Arthritis    Barrett's esophagus    Cancer ([place])    skin cancer   Chicken pox    Depression    Diabetes mellitus without complication ([place])    Family history of polyps in the colon    GERD (gastroesophageal reflux disease)    Hypertension    Osteopenia    Sleep apnea    borderline- no cpap use    Past Surgical History:  Past Surgical History:  Procedure Laterality Date   ABDOMINAL HYSTERECTOMY     BREAST BIOPSY  2007   COLONOSCOPY     KNEE ARTHROSCOPY Left    ~15 yrs ago    POLYPECTOMY     TONSILLECTOMY  1949   UPPER GASTROINTESTINAL ENDOSCOPY     Social History:  reports that she has quit smoking. She has never used smokeless tobacco. She reports that she does not drink alcohol and does not use drugs. Family History:  Family History  Problem Relation Age of Onset   Breast cancer Mother  Lung cancer Father    Liver cancer Father    Arthritis Sister    Depression Sister    Hyperlipidemia Sister    Hypertension Sister    Alcohol abuse Brother    Arthritis Brother    Depression Brother    Hyperlipidemia Brother    Hypertension Brother    Cancer Daughter    COPD Daughter    Depression Son    Arthritis Maternal Grandmother    Depression Maternal Grandmother    Hyperlipidemia Maternal Grandmother    Hypertension Maternal Grandmother    Stroke Maternal Grandmother    Alcohol abuse Maternal Grandfather    Arthritis Maternal Grandfather    Cancer Maternal Grandfather    Hyperlipidemia Maternal Grandfather    Hypertension Maternal Grandfather    Stroke Maternal Grandfather    Arthritis Paternal  Grandmother    Hyperlipidemia Paternal Grandmother    Hypertension Paternal Grandmother    Arthritis Paternal Grandfather    Alcohol abuse Paternal Grandfather    Diabetes Paternal Grandfather    Hyperlipidemia Paternal Grandfather    Heart disease Paternal Grandfather    Hearing loss Paternal Grandfather    Stroke Paternal Grandfather    Colon polyps Neg Hx    Esophageal cancer Neg Hx    Rectal cancer Neg Hx    Stomach cancer Neg Hx      HOME MEDICATIONS: Allergies as of 12/19/2022       Reactions   Penicillins Anaphylaxis   Whey Protein [protein] Shortness Of Breath   Lisinopril Other (See Comments), Swelling   Losartan Potassium Swelling   Losartan Nausea And Vomiting        Medication List        Accurate as of December 19, 2022  2:12 PM. If you have any questions, ask your nurse or doctor.          Accu-Chek Guide Me w/Device Kit Use daily for glucose control . Dx E11.9   Accu-Chek Guide test strip Generic drug: glucose blood Test once daily for glucose control.  Dx E11.9   Accu-Chek Softclix Lancets lancets Use once daily Dx E11.9   aspirin 81 MG chewable tablet Chew by mouth daily.   Aspirin Buf(CaCarb-MgCarb-MgO) 81 MG Tabs Take by mouth.   atenolol 50 MG tablet Commonly known as: TENORMIN TAKE 1 TABLET BY MOUTH EVERY DAY   atorvastatin 20 MG tablet Commonly known as: LIPITOR TAKE 1 TABLET BY MOUTH EVERY DAY   CALCIUM-D PO Take by mouth. Caltrate plus D   cycloSPORINE 0.05 % ophthalmic emulsion Commonly known as: RESTASIS 1 drop 2 (two) times daily.   esomeprazole 20 MG capsule Commonly known as: NEXIUM Take 20 mg by mouth every other day.   FreeStyle Libre 3 Sensor Misc 1 each by Does not apply route every 14 (fourteen) days.   glipiZIDE 5 MG tablet Commonly known as: GLUCOTROL Take 0.5 tablets (2.5 mg total) by mouth daily before breakfast.   hydrochlorothiazide 25 MG tablet Commonly known as: HYDRODIURIL TAKE 1 TABLET (25  MG TOTAL) BY MOUTH DAILY.   metFORMIN 500 MG tablet Commonly known as: GLUCOPHAGE TAKE 2 TABLETS (1,000 MG TOTAL) BY MOUTH 2 (TWO) TIMES DAILY WITH A MEAL.   multivitamin capsule Take by mouth.   Ozempic (1 MG/DOSE) 4 MG/3ML Sopn Generic drug: Semaglutide (1 MG/DOSE) Inject 1 mg into the skin once a week.   traZODone 150 MG tablet Commonly known as: DESYREL TAKE 1/2 TABLET IN THE EVENING   vitamin E 1000 UNIT capsule  Take 1,000 Units by mouth daily.         OBJECTIVE:   Vital Signs: BP 124/82 (BP Location: Left Arm, Patient Position: Sitting, Cuff Size: Large)   Pulse 84   Ht '5\' 4"'$  (1.626 m)   Wt 225 lb (102.1 kg)   SpO2 96%   BMI 38.62 kg/m   Wt Readings from Last 3 Encounters:  12/19/22 225 lb (102.1 kg)  11/27/22 229 lb (103.9 kg)  11/22/22 228 lb 3.2 oz (103.5 kg)     Exam: General: Pt appears well and is in NAD  Lungs: Clear with good BS bilat   Heart: RRR   Extremities: No pretibial edema.   Neuro: MS is good with appropriate affect, pt is alert and Ox3   DM foot exam: 12/19/2022   The skin of the feet is intact without sores or ulcerations. The pedal pulses are 2+ on right and 2+ on left. The sensation is intact to a screening 5.07, 10 gram monofilament bilaterally     DATA REVIEWED:  Lab Results  Component Value Date   HGBA1C 7.2 (A) 12/19/2022   HGBA1C 6.8 (H) 05/16/2022   HGBA1C 6.9 11/06/2021   Lab Results  Component Value Date   MICROALBUR 2.1 (H) 05/16/2022   LDLCALC 37 05/16/2022   CREATININE 1.02 05/16/2022    Lab Results  Component Value Date   CHOL 126 05/16/2022   HDL 57.00 05/16/2022   LDLCALC 37 05/16/2022   TRIG 159.0 (H) 05/16/2022   CHOLHDL 2 05/16/2022        Latest Reference Range & Units 05/16/22 10:09  Sodium 135 - 145 mEq/L 139  Potassium 3.5 - 5.1 mEq/L 4.1  Chloride 96 - 112 mEq/L 101  CO2 19 - 32 mEq/L 28  Glucose 70 - 99 mg/dL 141 (H)  BUN 6 - 23 mg/dL 17  Creatinine 0.40 - 1.20 mg/dL 1.02  Calcium  8.4 - 10.5 mg/dL 10.0  Alkaline Phosphatase 39 - 117 U/L 62  Albumin 3.5 - 5.2 g/dL 4.3  AST 0 - 37 U/L 29  ALT 0 - 35 U/L 34  Total Protein 6.0 - 8.3 g/dL 7.4  Total Bilirubin 0.2 - 1.2 mg/dL 0.6  GFR >60.00 mL/min 52.73 (L)    ASSESSMENT / PLAN / RECOMMENDATIONS:   1) Type 2 Diabetes Mellitus, Optimally controlled, Without complications - Most recent A1c of 7.2  %. Goal A1c < 7.0 %.    -A1c has slightly increased since the last visit -She was able to obtain freestyle libre samples from her PCP, we have downloaded this today, the patient has been noted with postprandial hyperglycemia at suppertime -I did give the patient option to increase her Ozempic but she opted to increase glipizide at supper instead    MEDICATIONS: Increase  glipizide 5 mg, half a tablet before breakfast and half a tablet before supper Continue metformin 500 mg, 2 tablets twice daily Continue Ozempic  1 mg weekly  EDUCATION / INSTRUCTIONS: BG monitoring instructions: Patient is instructed to check her blood sugars 1 times a day, fsting Call [facility] if: BG persistently < 70  I reviewed the Rule of 15 for the treatment of hypoglycemia in detail with the patient. Literature supplied.   2) Diabetic complications:  Eye: Does not have known diabetic retinopathy.  Neuro/ Feet: Does not have known diabetic peripheral neuropathy .  Renal: Patient does not have known baseline CKD. She   is intolerant to  ACEI/ARB  F/U in 6 months   Signed electronically by: Mack Guise, MD  [facility]  [facility] [address] [place], [place] [address] Phone: [phone] FAX: [phone]   CC: Isaac Bliss, Rayford Halsted, MD [place] [address] Phone: [phone]  Fax: [phone]  Return to Endocrinology clinic as below: Future Appointments  Date Time Provider [place]  12/26/2022  2:00 PM Gregor Hams, MD [facility] None

## 2022-12-19 NOTE — Patient Instructions (Signed)
-   Continue  Ozempic 1 mg once weekly  - Increase Glipizide 5 mg, Half a  tablet before Breakfast and half a tablet before Supper - Continue Metformin 500 mg , 2 tablets with Breakfast and 2 tablet with supper     HOW TO TREAT LOW BLOOD SUGARS (Blood sugar LESS THAN 70 MG/DL) Please follow the RULE OF 15 for the treatment of hypoglycemia treatment (when your (blood sugars are less than 70 mg/dL)   STEP 1: Take 15 grams of carbohydrates when your blood sugar is low, which includes:  3-4 GLUCOSE TABS  OR 3-4 OZ OF JUICE OR REGULAR SODA OR ONE TUBE OF GLUCOSE GEL    STEP 2: RECHECK blood sugar in 15 MINUTES STEP 3: If your blood sugar is still low at the 15 minute recheck --> then, go back to STEP 1 and treat AGAIN with another 15 grams of carbohydrates.

## 2022-12-26 ENCOUNTER — Ambulatory Visit: Payer: Medicare PPO | Admitting: Family Medicine

## 2022-12-26 NOTE — Progress Notes (Deleted)
   I, Peterson Lombard, LAT, ATC acting as a scribe for Lynne Leader, MD.  Diane Decker is a 77 y.o. female who presents to [place] at [facility] today for 1-monthf/u chronic R shoulder pain. Pt was last seen by Dr. CGeorgina Snellon 11/27/22 and was given a R subacromial steroid injection. Today, pt reports  Dx imaging: 11/27/22 R shoulder XR  Pertinent review of systems: ***  Relevant historical information: ***   Exam:  There were no vitals taken for this visit. General: Well Developed, well nourished, and in no acute distress.   MSK: ***    Lab and Radiology Results No results found for this or any previous visit (from the past 72 hour(s)). No results found.     Assessment and Plan: 77y.o. female with ***   PDMP not reviewed this encounter. No orders of the defined types were placed in this encounter.  No orders of the defined types were placed in this encounter.    Discussed warning signs or symptoms. Please see discharge instructions. Patient expresses understanding.   ***

## 2022-12-31 DIAGNOSIS — Z961 Presence of intraocular lens: Secondary | ICD-10-CM | POA: Diagnosis not present

## 2023-01-02 ENCOUNTER — Encounter: Payer: Self-pay | Admitting: Family Medicine

## 2023-01-02 ENCOUNTER — Ambulatory Visit: Payer: Medicare PPO | Admitting: Family Medicine

## 2023-01-02 VITALS — BP 140/82 | HR 72 | Ht 64.0 in | Wt 225.8 lb

## 2023-01-02 DIAGNOSIS — G8929 Other chronic pain: Secondary | ICD-10-CM | POA: Diagnosis not present

## 2023-01-02 DIAGNOSIS — D2110 Benign neoplasm of connective and other soft tissue of unspecified upper limb, including shoulder: Secondary | ICD-10-CM | POA: Diagnosis not present

## 2023-01-02 DIAGNOSIS — M25511 Pain in right shoulder: Secondary | ICD-10-CM | POA: Diagnosis not present

## 2023-01-02 NOTE — Progress Notes (Unsigned)
Shirlyn Goltz, PhD, LAT, ATC acting as a scribe for Lynne Leader, MD.  Diane Decker is a 77 y.o. female who presents to [place] [facility] today for 13-monthf/u chronic R shoulder pain. Pt was last seen by Dr. CGeorgina Snellon 11/27/22 and was given a R subacromial steroid injection. Today, pt reports significant improvement of shoulder s/p inj. Some soreness today after quilting, more at the deltoid. Otherwise, pt is happy with the improvement.   Dx imaging: 11/27/22 R shoulder XR   Pertinent review of systems: No fevers or chills  Relevant historical information: synovial osteochondromatosis.right shoulder   Exam:  BP (!) 140/82   Pulse 72   Ht '5\' 4"'$  (1.626 m)   Wt 225 lb 12.8 oz (102.4 kg)   SpO2 98%   BMI 38.76 kg/m  General: Well Developed, well nourished, and in no acute distress.   MSK: Right shoulder: Normal-appearing normal motion some pain with abduction.  Intact strength.    Lab and Radiology Results  [place] JOINT SPACE STRUCTURES UP RIGHT(NO LINKED CHARGES)  Result Date: 12/12/2022 Procedure: Real-time Ultrasound Guided Injection of right shoulder subacromial bursa Device: Philips Affiniti 50G Images permanently stored and available for review in PACS Ultrasound evaluation prior to injection reveals large hyperechoic calcific change superficial to subscapularis tendon.  Large subacromial and subdeltoid bursitis.  AC DJD. Verbal informed consent obtained.  Discussed risks and benefits of procedure. Warned about infection, bleeding, hyperglycemia damage to structures among others. Patient expresses understanding and agreement Time-out conducted.  Noted no overlying erythema, induration, or other signs of local infection.  Skin prepped in a sterile fashion.  Local anesthesia: Topical Ethyl chloride.  With sterile technique and under real time ultrasound guidance: 40 mg of Kenalog and 2 mL of Marcaine injected into subacromial bursa. Fluid  seen entering the bursa.  Completed without difficulty  Pain immediately resolved suggesting accurate placement of the medication.  Advised to call if fevers/chills, erythema, induration, drainage, or persistent bleeding.  Images permanently stored and available for review in the ultrasound unit. Impression: Technically successful ultrasound guided injection.   DG Shoulder Right  Result Date: 11/28/2022 CLINICAL DATA:  Pain EXAM: RIGHT SHOULDER - 2+ VIEW COMPARISON:  None Available. FINDINGS: Limited views of the right chest are normal. At least 4 rounded calcifications are identified in the anterior joint space. No acute fracture. No dislocation. Mild AC joint degenerative changes. No other significant abnormalities. IMPRESSION: 1. Mild AC joint degenerative changes. 2. At least 4 rounded calcifications are identified in the anterior joint space. The finding suggests synovial osteochondromatosis. Electronically Signed   By: DDorise BullionIII M.D.   On: 11/28/2022 09:24       Assessment and Plan: 77y.o. female with right shoulder pain with calcific change present within the shoulder joint thought to be synovial osteochondromatosis. This is a benign condition.  Her symptoms are well-managed enough at this point that she would not like to have surgery or evaluated further as long as she feels well.  Plan for watchful waiting for now.  If worsening proceed to MRI for potential surgical evaluation if needed.  Recheck as needed. Anticipate that she will probably benefit from repeat steroid injections in the future.   PDMP not reviewed this encounter. No orders of the defined types were placed in this encounter.  No orders of the defined types were placed in this encounter.    Discussed warning signs or symptoms. Please see discharge instructions. Patient  expresses understanding.   The above documentation has been reviewed and is accurate and complete Lynne Leader, M.D.

## 2023-01-02 NOTE — Patient Instructions (Addendum)
Thank you for coming in today.   Recheck in 2 months.   Let me know if this gets worse.   synovial osteochondromatosis

## 2023-01-03 DIAGNOSIS — D2110 Benign neoplasm of connective and other soft tissue of unspecified upper limb, including shoulder: Secondary | ICD-10-CM | POA: Insufficient documentation

## 2023-01-03 DIAGNOSIS — G8929 Other chronic pain: Secondary | ICD-10-CM | POA: Insufficient documentation

## 2023-01-14 ENCOUNTER — Telehealth: Payer: Self-pay | Admitting: Internal Medicine

## 2023-01-14 DIAGNOSIS — N644 Mastodynia: Secondary | ICD-10-CM

## 2023-01-14 NOTE — Telephone Encounter (Signed)
Order placed

## 2023-01-14 NOTE — Telephone Encounter (Signed)
Okay to order Diagnostic mammogram? Patient is having tenderness with her left breast.  Her last mammogram was 06/05/22. PCP is out of the office.

## 2023-01-14 NOTE — Telephone Encounter (Addendum)
Pt called to inform MD that she needs to have breast imaging done ASAP, for her condition (breast tenderness and itching)  Pt informed that MD is OOO this week.   Pt asked if another provider could put in an order for breast imaging, as per [place].   Please call Pt back, as soon as possible, to discuss.  [phone]

## 2023-01-14 NOTE — Telephone Encounter (Signed)
It is ok to place order for Dx mammogram due to new onset of breast tenderness. Thanks, BJ

## 2023-01-23 ENCOUNTER — Ambulatory Visit
Admission: RE | Admit: 2023-01-23 | Discharge: 2023-01-23 | Disposition: A | Discharge status: Home / Self Care | Payer: Medicare PPO | Source: Ambulatory Visit | Attending: Internal Medicine | Admitting: Internal Medicine

## 2023-01-23 DIAGNOSIS — N644 Mastodynia: Secondary | ICD-10-CM | POA: Diagnosis not present

## 2023-04-19 ENCOUNTER — Other Ambulatory Visit: Payer: Self-pay | Admitting: Internal Medicine

## 2023-04-24 ENCOUNTER — Other Ambulatory Visit: Payer: Self-pay

## 2023-04-24 ENCOUNTER — Telehealth: Payer: Self-pay | Admitting: Internal Medicine

## 2023-04-24 MED ORDER — TRAZODONE HCL 150 MG PO TABS: ORAL_TABLET | ORAL | 1 refills | Status: DC

## 2023-04-24 NOTE — Telephone Encounter (Signed)
Rx refilled.

## 2023-04-24 NOTE — Telephone Encounter (Signed)
Prescription Request  04/24/2023  LOV: 11/22/2022  What is the name of the medication or equipment? traZODone (DESYREL) 150 MG tablet atorvastatin (LIPITOR) 20 MG tablet  Have you contacted your pharmacy to request a refill? Yes   Which pharmacy would you like this sent to?   [id number] - [place], [place] - [address] AT CORNER OF [facility] [address] [place] [address] Phone: [phone] Fax: [phone]    Patient notified that their request is being sent to the clinical staff for review and that they should receive a response within 2 business days.   Please advise at Mobile [phone] (mobile)

## 2023-04-29 ENCOUNTER — Other Ambulatory Visit: Payer: Self-pay | Admitting: Internal Medicine

## 2023-05-01 ENCOUNTER — Ambulatory Visit: Payer: Medicare PPO | Admitting: Internal Medicine

## 2023-05-01 VITALS — BP 120/80 | HR 73 | Temp 98.2°F | Wt 220.6 lb

## 2023-05-01 DIAGNOSIS — E119 Type 2 diabetes mellitus without complications: Secondary | ICD-10-CM

## 2023-05-01 DIAGNOSIS — R600 Localized edema: Secondary | ICD-10-CM

## 2023-05-01 DIAGNOSIS — R0609 Other forms of dyspnea: Secondary | ICD-10-CM | POA: Diagnosis not present

## 2023-05-01 DIAGNOSIS — R197 Diarrhea, unspecified: Secondary | ICD-10-CM

## 2023-05-01 DIAGNOSIS — R5383 Other fatigue: Secondary | ICD-10-CM

## 2023-05-01 LAB — TSH: TSH: 1.76 u[IU]/mL (ref 0.35–5.50)

## 2023-05-01 LAB — VITAMIN B12: Vitamin B-12: >1500 pg/mL — ABNORMAL HIGH (ref 211–911)

## 2023-05-01 LAB — COMPREHENSIVE METABOLIC PANEL
ALT: 50 U/L — ABNORMAL HIGH (ref 0–35)
AST: 57 U/L — ABNORMAL HIGH (ref 0–37)
Albumin: 4.3 g/dL (ref 3.5–5.2)
Alkaline Phosphatase: 67 U/L (ref 39–117)
BUN: 15 mg/dL (ref 6–23)
CO2: 28 mEq/L (ref 19–32)
Calcium: 10.1 mg/dL (ref 8.4–10.5)
Chloride: 102 mEq/L (ref 96–112)
Creatinine, Ser: 1.12 mg/dL (ref 0.40–1.20)
GFR: 46.81 mL/min — ABNORMAL LOW (ref >60.00)
Glucose, Bld: 95 mg/dL (ref 70–99)
Potassium: 4.3 mEq/L (ref 3.5–5.1)
Sodium: 142 mEq/L (ref 135–145)
Total Bilirubin: 0.6 mg/dL (ref 0.2–1.2)
Total Protein: 7.3 g/dL (ref 6.0–8.3)

## 2023-05-01 LAB — CBC WITH DIFFERENTIAL/PLATELET
Basophils Absolute: 0 10*3/uL (ref 0.0–0.1)
Basophils Relative: 0.3 % (ref 0.0–3.0)
Eosinophils Absolute: 0.2 10*3/uL (ref 0.0–0.7)
Eosinophils Relative: 2.6 % (ref 0.0–5.0)
HCT: 39.2 % (ref 36.0–46.0)
Hemoglobin: 12.8 g/dL (ref 12.0–15.0)
Lymphocytes Relative: 33.4 % (ref 12.0–46.0)
Lymphs Abs: 2.6 10*3/uL (ref 0.7–4.0)
MCHC: 32.8 g/dL (ref 30.0–36.0)
MCV: 92.3 fl (ref 78.0–100.0)
Monocytes Absolute: 0.6 10*3/uL (ref 0.1–1.0)
Monocytes Relative: 7.5 % (ref 3.0–12.0)
Neutro Abs: 4.4 10*3/uL (ref 1.4–7.7)
Neutrophils Relative %: 56.2 % (ref 43.0–77.0)
Platelets: 232 10*3/uL (ref 150.0–400.0)
RBC: 4.24 Mil/uL (ref 3.87–5.11)
RDW: 12.9 % (ref 11.5–15.5)
WBC: 7.8 10*3/uL (ref 4.0–10.5)

## 2023-05-01 LAB — VITAMIN D 25 HYDROXY (VIT D DEFICIENCY, FRACTURES): VITD: 29.26 ng/mL — ABNORMAL LOW (ref 30.00–100.00)

## 2023-05-01 MED ORDER — ACCU-CHEK GUIDE VI STRP: ORAL_STRIP | 12 refills | Status: AC

## 2023-05-01 MED ORDER — ACCU-CHEK GUIDE ME W/DEVICE KIT: PACK | 0 refills | Status: AC

## 2023-05-01 MED ORDER — ACCU-CHEK SOFTCLIX LANCETS MISC: 0 refills | Status: AC

## 2023-05-01 NOTE — Progress Notes (Signed)
Established Patient Office Visit     CC/Reason for Visit: Discuss acute concerns  HPI: Diane Decker is a 77 y.o. female who is coming in today for the above mentioned reasons. Past Medical History is significant for: Hypertension, hyperlipidemia, type 2 diabetes followed by endocrinology, GERD, Barrett's esophagus, obesity.  She is here to discuss 2 main complaints:  1.  She has been having significant diarrhea.  She has had significant incontinent episodes most recently on an airplane that was very embarrassing to her.  She is on metformin at 1000 mg twice daily and Ozempic 1 mg.  She states that her symptoms got worse after her Ozempic dose was increased in February.  2.  She continues to complain of fatigue.  This has been a longstanding issue for her.  Over the past month or so she has also developed significant lower extremity edema that is bilateral.  Upon further questioning she mentions dyspnea on exertion.  Daughter states she is unable to walk 100 yards without getting short of breath.  She is also unable to bring her garbage cans from the street back to the house without having to stop and take a break.  She has not had any chest discomfort.  Recent vitamin B12, D and TSH levels were within range.   Past Medical/Surgical History: Past Medical History:  Diagnosis Date   Allergy    Arthritis    Barrett's esophagus    Cancer (HCC)    skin cancer   Chicken pox    Depression    Diabetes mellitus without complication (HCC)    Family history of polyps in the colon    GERD (gastroesophageal reflux disease)    Hypertension    Osteopenia    Sleep apnea    borderline- no cpap use     Past Surgical History:  Procedure Laterality Date   ABDOMINAL HYSTERECTOMY     BREAST BIOPSY  2007   COLONOSCOPY     KNEE ARTHROSCOPY Left    ~15 yrs ago    POLYPECTOMY     TONSILLECTOMY  1949   UPPER GASTROINTESTINAL ENDOSCOPY      Social History:  reports that she  has quit smoking. She has never used smokeless tobacco. She reports that she does not drink alcohol and does not use drugs.  Allergies: Allergies  Allergen Reactions   Penicillins Anaphylaxis   Whey Protein [Protein] Shortness Of Breath   Lisinopril Other (See Comments) and Swelling   Losartan Potassium Swelling   Losartan Nausea And Vomiting    Family History: ` Family History  Problem Relation Age of Onset   Breast cancer Mother    Lung cancer Father    Liver cancer Father    Arthritis Sister    Depression Sister    Hyperlipidemia Sister    Hypertension Sister    Alcohol abuse Brother    Arthritis Brother    Depression Brother    Hyperlipidemia Brother    Hypertension Brother    Cancer Daughter    COPD Daughter    Depression Son    Arthritis Maternal Grandmother    Depression Maternal Grandmother    Hyperlipidemia Maternal Grandmother    Hypertension Maternal Grandmother    Stroke Maternal Grandmother    Alcohol abuse Maternal Grandfather    Arthritis Maternal Grandfather    Cancer Maternal Grandfather    Hyperlipidemia Maternal Grandfather    Hypertension Maternal Grandfather    Stroke Maternal Grandfather    Arthritis  Paternal Grandmother    Hyperlipidemia Paternal Grandmother    Hypertension Paternal Grandmother    Arthritis Paternal Grandfather    Alcohol abuse Paternal Grandfather    Diabetes Paternal Grandfather    Hyperlipidemia Paternal Grandfather    Heart disease Paternal Grandfather    Hearing loss Paternal Grandfather    Stroke Paternal Grandfather    Colon polyps Neg Hx    Esophageal cancer Neg Hx    Rectal cancer Neg Hx    Stomach cancer Neg Hx      Current Outpatient Medications:    aspirin 81 MG chewable tablet, Chew by mouth daily., Disp: , Rfl:    Aspirin Buf,CaCarb-MgCarb-MgO, 81 MG TABS, Take by mouth., Disp: , Rfl:    atenolol (TENORMIN) 50 MG tablet, TAKE 1 TABLET BY MOUTH EVERY DAY, Disp: 90 tablet, Rfl: 0   atorvastatin (LIPITOR)  20 MG tablet, TAKE 1 TABLET BY MOUTH EVERY DAY, Disp: 90 tablet, Rfl: 0   Calcium Carbonate-Vitamin D (CALCIUM-D PO), Take by mouth. Caltrate plus D, Disp: , Rfl:    cycloSPORINE (RESTASIS) 0.05 % ophthalmic emulsion, 1 drop 2 (two) times daily., Disp: , Rfl:    esomeprazole (NEXIUM) 20 MG capsule, Take 20 mg by mouth every other day., Disp: , Rfl:    glipiZIDE (GLUCOTROL) 5 MG tablet, Take 0.5 tablets (2.5 mg total) by mouth 2 (two) times daily before a meal., Disp: 90 tablet, Rfl: 3   hydrochlorothiazide (HYDRODIURIL) 25 MG tablet, TAKE 1 TABLET (25 MG TOTAL) BY MOUTH DAILY., Disp: 90 tablet, Rfl: 1   metFORMIN (GLUCOPHAGE) 500 MG tablet, Take 2 tablets (1,000 mg total) by mouth 2 (two) times daily with a meal., Disp: 360 tablet, Rfl: 3   Multiple Vitamin (MULTIVITAMIN) capsule, Take by mouth., Disp: , Rfl:    Semaglutide, 1 MG/DOSE, (OZEMPIC, 1 MG/DOSE,) 4 MG/3ML SOPN, Inject 1 mg into the skin once a week., Disp: 9 mL, Rfl: 3   traZODone (DESYREL) 150 MG tablet, TAKE 1/2 TABLET IN THE EVENING, Disp: 45 tablet, Rfl: 1   vitamin E 1000 UNIT capsule, Take 1,000 Units by mouth daily., Disp: , Rfl:    Accu-Chek Softclix Lancets lancets, Use once daily Dx E11.9, Disp: 100 each, Rfl: 0   Blood Glucose Monitoring Suppl (ACCU-CHEK GUIDE ME) w/Device KIT, Use daily for glucose control . Dx E11.9, Disp: 1 kit, Rfl: 0   glucose blood (ACCU-CHEK GUIDE) test strip, Test once daily for glucose control.  Dx E11.9, Disp: 100 each, Rfl: 12  Review of Systems:  Negative unless indicated in HPI.   Physical Exam: Vitals:   05/01/23 1001  BP: 120/80  Pulse: 73  Temp: 98.2 F (36.8 C)  TempSrc: Oral  SpO2: 98%  Weight: 220 lb 9.6 oz (100.1 kg)    Body mass index is 37.87 kg/m.   Physical Exam Vitals reviewed.  Constitutional:      Appearance: Normal appearance.  HENT:     Head: Normocephalic and atraumatic.  Eyes:     Conjunctiva/sclera: Conjunctivae normal.     Pupils: Pupils are equal,  round, and reactive to light.  Cardiovascular:     Rate and Rhythm: Normal rate and regular rhythm.  Pulmonary:     Effort: Pulmonary effort is normal.     Breath sounds: Normal breath sounds.  Musculoskeletal:     Right lower leg: 3+ Edema present.     Left lower leg: 3+ Edema present.  Skin:    General: Skin is warm and dry.  Neurological:  General: No focal deficit present.     Mental Status: She is alert and oriented to person, place, and time.  Psychiatric:        Mood and Affect: Mood normal.        Behavior: Behavior normal.        Thought Content: Thought content normal.        Judgment: Judgment normal.      Impression and Plan:  DOE (dyspnea on exertion) -     ECHOCARDIOGRAM COMPLETE; Future -     Ambulatory referral to Cardiology  Bilateral lower extremity edema -     Comprehensive metabolic panel; Future -     ECHOCARDIOGRAM COMPLETE; Future -     Ambulatory referral to Cardiology  Diarrhea, unspecified type  Other fatigue -     TSH; Future -     Vitamin B12; Future -     VITAMIN D 25 Hydroxy (Vit-D Deficiency, Fractures); Future -     CBC with Differential/Platelet; Future -     ECHOCARDIOGRAM COMPLETE; Future -     Ambulatory referral to Cardiology  Type 2 diabetes mellitus without complication, without long-term current use of insulin (HCC) -     Accu-Chek Softclix Lancets; Use once daily Dx E11.9  Dispense: 100 each; Refill: 0 -     Accu-Chek Guide Me; Use daily for glucose control . Dx E11.9  Dispense: 1 kit; Refill: 0 -     Accu-Chek Guide; Test once daily for glucose control.  Dx E11.9  Dispense: 100 each; Refill: 12   -Suspect her diarrhea is due to a combination of Ozempic and metformin.  She prefers to discuss with her endocrinologist and she will notify them today. -Constellation of significant bilateral lower extremity edema fatigue and dyspnea on exertion is concerning to me for cardiac etiology.  Will order labs today, sent for  echocardiogram and placed a cardiology referral for further evaluation.  She will also get fitted for and wear compression stockings.  Time spent:34 minutes reviewing chart, interviewing and examining patient and formulating plan of care.     Chaya Jan, MD [facility]

## 2023-05-02 ENCOUNTER — Telehealth: Payer: Self-pay

## 2023-05-02 NOTE — Telephone Encounter (Signed)
She is calling stating that she is having uncontrollable diarrhea and she is stating that she is carrying around diapers due to the accidents she is continue to have , please advise?

## 2023-05-03 MED ORDER — SEMAGLUTIDE(0.25 OR 0.5MG/DOS) 2 MG/3ML ~~LOC~~ SOPN: 0.5000 mg | PEN_INJECTOR | SUBCUTANEOUS | 3 refills | Status: DC

## 2023-05-03 NOTE — Telephone Encounter (Signed)
I LVM to return my call and I also sent Dr [facility] recommendations in a mychart note to the patient

## 2023-05-09 ENCOUNTER — Encounter: Payer: Self-pay | Admitting: Internal Medicine

## 2023-05-09 ENCOUNTER — Other Ambulatory Visit: Payer: Self-pay | Admitting: Internal Medicine

## 2023-05-09 DIAGNOSIS — E559 Vitamin D deficiency, unspecified: Secondary | ICD-10-CM

## 2023-05-09 DIAGNOSIS — R7401 Elevation of levels of liver transaminase levels: Secondary | ICD-10-CM

## 2023-05-09 MED ORDER — VITAMIN D (ERGOCALCIFEROL) 1.25 MG (50000 UNIT) PO CAPS
50000.0000 [IU] | ORAL_CAPSULE | ORAL | 0 refills | Status: AC
Start: 2023-05-09 — End: 2023-07-26

## 2023-05-10 ENCOUNTER — Telehealth (HOSPITAL_BASED_OUTPATIENT_CLINIC_OR_DEPARTMENT_OTHER): Payer: Self-pay | Admitting: *Deleted

## 2023-05-10 NOTE — Telephone Encounter (Signed)
Left message for patient to call and schedule the Echocardiogram ordered by Dr. Philip Aspen

## 2023-05-17 ENCOUNTER — Other Ambulatory Visit: Payer: Self-pay | Admitting: Internal Medicine

## 2023-05-17 DIAGNOSIS — I10 Essential (primary) hypertension: Secondary | ICD-10-CM

## 2023-05-22 ENCOUNTER — Other Ambulatory Visit: Payer: Self-pay | Admitting: Internal Medicine

## 2023-05-22 DIAGNOSIS — Z1231 Encounter for screening mammogram for malignant neoplasm of breast: Secondary | ICD-10-CM

## 2023-06-04 ENCOUNTER — Ambulatory Visit (INDEPENDENT_AMBULATORY_CARE_PROVIDER_SITE_OTHER): Payer: Medicare PPO

## 2023-06-04 DIAGNOSIS — R0609 Other forms of dyspnea: Secondary | ICD-10-CM | POA: Diagnosis not present

## 2023-06-04 DIAGNOSIS — R5383 Other fatigue: Secondary | ICD-10-CM | POA: Diagnosis not present

## 2023-06-04 DIAGNOSIS — R600 Localized edema: Secondary | ICD-10-CM

## 2023-06-04 LAB — ECHOCARDIOGRAM COMPLETE
AR max vel: 1.82 cm2
AV Area VTI: 1.85 cm2
AV Area mean vel: 1.76 cm2
AV Mean grad: 5 mmHg
AV Peak grad: 9.6 mmHg
Ao pk vel: 1.55 m/s
Area-P 1/2: 2.63 cm2
Calc EF: 61.8 %
S' Lateral: 2.67 cm
Single Plane A2C EF: 59.4 %
Single Plane A4C EF: 64 %

## 2023-06-07 ENCOUNTER — Ambulatory Visit: Payer: Medicare PPO

## 2023-06-20 ENCOUNTER — Ambulatory Visit
Admission: RE | Admit: 2023-06-20 | Discharge: 2023-06-20 | Disposition: A | Discharge status: Home / Self Care | Payer: Medicare PPO | Source: Ambulatory Visit | Attending: Internal Medicine | Admitting: Internal Medicine

## 2023-06-20 DIAGNOSIS — L821 Other seborrheic keratosis: Secondary | ICD-10-CM | POA: Diagnosis not present

## 2023-06-20 DIAGNOSIS — L570 Actinic keratosis: Secondary | ICD-10-CM | POA: Diagnosis not present

## 2023-06-20 DIAGNOSIS — Z1231 Encounter for screening mammogram for malignant neoplasm of breast: Secondary | ICD-10-CM | POA: Diagnosis not present

## 2023-06-20 DIAGNOSIS — Z85828 Personal history of other malignant neoplasm of skin: Secondary | ICD-10-CM | POA: Diagnosis not present

## 2023-06-20 DIAGNOSIS — C4441 Basal cell carcinoma of skin of scalp and neck: Secondary | ICD-10-CM | POA: Diagnosis not present

## 2023-06-20 DIAGNOSIS — L814 Other melanin hyperpigmentation: Secondary | ICD-10-CM | POA: Diagnosis not present

## 2023-06-20 DIAGNOSIS — D225 Melanocytic nevi of trunk: Secondary | ICD-10-CM | POA: Diagnosis not present

## 2023-06-20 DIAGNOSIS — C44519 Basal cell carcinoma of skin of other part of trunk: Secondary | ICD-10-CM | POA: Diagnosis not present

## 2023-07-03 ENCOUNTER — Encounter: Payer: Self-pay | Admitting: Internal Medicine

## 2023-07-03 ENCOUNTER — Ambulatory Visit: Payer: Medicare PPO | Attending: Cardiovascular Disease | Admitting: Cardiovascular Disease

## 2023-07-03 ENCOUNTER — Ambulatory Visit: Payer: Medicare PPO | Admitting: Internal Medicine

## 2023-07-03 ENCOUNTER — Encounter: Payer: Self-pay | Admitting: Cardiovascular Disease

## 2023-07-03 VITALS — BP 114/66 | HR 74 | Ht 62.0 in | Wt 223.6 lb

## 2023-07-03 VITALS — BP 124/72 | HR 76 | Ht 62.0 in | Wt 224.0 lb

## 2023-07-03 DIAGNOSIS — I10 Essential (primary) hypertension: Secondary | ICD-10-CM

## 2023-07-03 DIAGNOSIS — R7989 Other specified abnormal findings of blood chemistry: Secondary | ICD-10-CM

## 2023-07-03 DIAGNOSIS — Z7984 Long term (current) use of oral hypoglycemic drugs: Secondary | ICD-10-CM

## 2023-07-03 DIAGNOSIS — R609 Edema, unspecified: Secondary | ICD-10-CM

## 2023-07-03 DIAGNOSIS — Z7985 Long-term (current) use of injectable non-insulin antidiabetic drugs: Secondary | ICD-10-CM

## 2023-07-03 DIAGNOSIS — E119 Type 2 diabetes mellitus without complications: Secondary | ICD-10-CM | POA: Diagnosis not present

## 2023-07-03 DIAGNOSIS — R7401 Elevation of levels of liver transaminase levels: Secondary | ICD-10-CM | POA: Diagnosis not present

## 2023-07-03 DIAGNOSIS — E1165 Type 2 diabetes mellitus with hyperglycemia: Secondary | ICD-10-CM

## 2023-07-03 DIAGNOSIS — E7800 Pure hypercholesterolemia, unspecified: Secondary | ICD-10-CM

## 2023-07-03 DIAGNOSIS — R0609 Other forms of dyspnea: Secondary | ICD-10-CM

## 2023-07-03 DIAGNOSIS — E559 Vitamin D deficiency, unspecified: Secondary | ICD-10-CM | POA: Diagnosis not present

## 2023-07-03 LAB — POCT GLUCOSE (DEVICE FOR HOME USE): POC Glucose: 114 mg/dL — AB (ref 70–99)

## 2023-07-03 LAB — POCT GLYCOSYLATED HEMOGLOBIN (HGB A1C): Hemoglobin A1C: 7 % — AB (ref 4.0–5.6)

## 2023-07-03 MED ORDER — SEMAGLUTIDE(0.25 OR 0.5MG/DOS) 2 MG/3ML ~~LOC~~ SOPN: 0.5000 mg | PEN_INJECTOR | SUBCUTANEOUS | 3 refills | Status: DC

## 2023-07-03 MED ORDER — GLIPIZIDE 5 MG PO TABS
2.5000 mg | ORAL_TABLET | Freq: Every day | ORAL | 3 refills | Status: DC
Start: 2023-07-03 — End: 2024-01-01

## 2023-07-03 MED ORDER — EMPAGLIFLOZIN 10 MG PO TABS: 10.0000 mg | ORAL_TABLET | Freq: Every day | ORAL | 3 refills | Status: DC

## 2023-07-03 NOTE — Progress Notes (Signed)
Name: Diane Decker  Age/ Sex: 77 y.o., female   MRN/ DOB: [id number], [birth year]     PCP: Philip Aspen, Limmie Patricia, MD   Reason for Endocrinology Evaluation: Type 2 Diabetes Mellitus  Initial Endocrine Consultative Visit: 11/10/2020    PATIENT IDENTIFIER: Diane Decker is a 77 y.o. female with a past medical history of T2DM, barrett's esophagus. The patient has followed with Endocrinology clinic since 11/10/2020 for consultative assistance with management of her diabetes.  DIABETIC HISTORY:  Ms. Wisler was diagnosed with DM at age 68, ozempic caused GI side effects. . Her hemoglobin A1c has ranged from 6.6%  in 2021, peaking at 9.7 %in 2021.    On her initial visit to our clinic her A1c was 6.6 % she was on Glipizide XL, Metformin and wanted to give ozempic another try. We swiched XL glipizide to regular release   Started Jardiance 06/2023    SUBJECTIVE:   During the last visit (12/19/2022): A1c 7.2%     Today (07/03/2023): Diane Decker  is here for a follow up on diabetes management.  She is accompanied by a friend.  She has been checking glucose multiple times daily through freestyle [place], she did not bring her receiver today.  She developed diarrhea in 04/2023, she attributed this to glipizide, she was asked to hold Ozempic for couple weeks, restarted on a smaller dose , she was evaluated by her PCP at the time and was noted to have elevated LFTs, she is due for repeat and would like to have that here today   Diarrhea has improved Denies nausea or vomiting   She was seen by cardiology today for evaluation of shortness of breath, she did bring lab orders to have BMP done here, cardiology recommended Jardiance  HOME DIABETES REGIMEN:  Glipizide 5 mg,1 tab every morning  Metformin 500 mg , 2 tablets with Breakfast and 2 tablet with supper  Ozempic 0.5 mg once weekly ( Tuesday)    Statin: yes  ACE-I/ARB: Intolerant to lisinopril and  losartan    CONTINUOUS GLUCOSE MONITORING RECORD INTERPRETATION : n/a    DIABETIC COMPLICATIONS: Microvascular complications:   Denies: CKD, retinopathy, neuropathy  Last Eye Exam: Completed 10/2021  Macrovascular complications:   Denies: CAD, CVA, PVD   HISTORY:  Past Medical History:  Past Medical History:  Diagnosis Date   Allergy    Arthritis    Barrett's esophagus    Cancer (HCC)    skin cancer   Chicken pox    Depression    Diabetes mellitus without complication (HCC)    Family history of polyps in the colon    GERD (gastroesophageal reflux disease)    Hypertension    Osteopenia    Sleep apnea    borderline- no cpap use    Past Surgical History:  Past Surgical History:  Procedure Laterality Date   ABDOMINAL HYSTERECTOMY     BREAST BIOPSY  2007   COLONOSCOPY     KNEE ARTHROSCOPY Left    ~15 yrs ago    POLYPECTOMY     TONSILLECTOMY  1949   UPPER GASTROINTESTINAL ENDOSCOPY     Social History:  reports that she has quit smoking. She has never used smokeless tobacco. She reports that she does not drink alcohol and does not use drugs. Family History:  Family History  Problem Relation Age of Onset   Breast cancer Mother    Lung cancer Father    Liver cancer Father    Arthritis Sister  Depression Sister    Hyperlipidemia Sister    Hypertension Sister    Alcohol abuse Brother    Arthritis Brother    Depression Brother    Hyperlipidemia Brother    Hypertension Brother    Cancer Daughter    COPD Daughter    Depression Son    Arthritis Maternal Grandmother    Depression Maternal Grandmother    Hyperlipidemia Maternal Grandmother    Hypertension Maternal Grandmother    Stroke Maternal Grandmother    Alcohol abuse Maternal Grandfather    Arthritis Maternal Grandfather    Cancer Maternal Grandfather    Hyperlipidemia Maternal Grandfather    Hypertension Maternal Grandfather    Stroke Maternal Grandfather    Arthritis Paternal Grandmother     Hyperlipidemia Paternal Grandmother    Hypertension Paternal Grandmother    Arthritis Paternal Grandfather    Alcohol abuse Paternal Grandfather    Diabetes Paternal Grandfather    Hyperlipidemia Paternal Grandfather    Heart disease Paternal Grandfather    Hearing loss Paternal Grandfather    Stroke Paternal Grandfather    Colon polyps Neg Hx    Esophageal cancer Neg Hx    Rectal cancer Neg Hx    Stomach cancer Neg Hx      HOME MEDICATIONS: Allergies as of 07/03/2023       Reactions   Penicillins Anaphylaxis   Whey Protein [protein] Shortness Of Breath   Lisinopril Other (See Comments), Swelling   Losartan Potassium Swelling   Losartan Nausea And Vomiting        Medication List        Accurate as of July 03, 2023  2:23 PM. If you have any questions, ask your nurse or doctor.          STOP taking these medications    Aspirin Buf(CaCarb-MgCarb-MgO) 81 MG Tabs Stopped by: Mihai Croitoru   CALCIUM-D PO Stopped by: Mihai Croitoru       TAKE these medications    Accu-Chek Guide Me w/Device Kit Use daily for glucose control . Dx E11.9   Accu-Chek Guide test strip Generic drug: glucose blood Test once daily for glucose control.  Dx E11.9   Accu-Chek Softclix Lancets lancets Use once daily Dx E11.9   aspirin 81 MG chewable tablet Chew by mouth daily.   atenolol 50 MG tablet Commonly known as: TENORMIN TAKE 1 TABLET BY MOUTH EVERY DAY   atorvastatin 20 MG tablet Commonly known as: LIPITOR TAKE 1 TABLET BY MOUTH EVERY DAY   cycloSPORINE 0.05 % ophthalmic emulsion Commonly known as: RESTASIS 1 drop 2 (two) times daily.   empagliflozin 10 MG Tabs tablet Commonly known as: Jardiance Take 1 tablet (10 mg total) by mouth daily before breakfast. Started by: Johnney Ou [place]   esomeprazole 20 MG capsule Commonly known as: NEXIUM Take 20 mg by mouth daily at 6 (six) AM.   glipiZIDE 5 MG tablet Commonly known as: GLUCOTROL Take 0.5 tablets  (2.5 mg total) by mouth daily before breakfast. What changed: when to take this Changed by: Johnney Ou Fatimah Sundquist   hydrochlorothiazide 25 MG tablet Commonly known as: HYDRODIURIL TAKE 1 TABLET (25 MG TOTAL) BY MOUTH DAILY.   metFORMIN 500 MG tablet Commonly known as: GLUCOPHAGE Take 2 tablets (1,000 mg total) by mouth 2 (two) times daily with a meal.   multivitamin capsule Take 1 capsule by mouth daily.   Semaglutide(0.25 or 0.5MG /DOS) 2 MG/3ML Sopn Inject 0.5 mg into the skin once a week.   traZODone 150  MG tablet Commonly known as: DESYREL TAKE 1/2 TABLET IN THE EVENING   Vitamin D (Ergocalciferol) 1.25 MG (50000 UNIT) Caps capsule Commonly known as: DRISDOL Take 1 capsule (50,000 Units total) by mouth every 7 (seven) days for 12 doses.   vitamin E 1000 UNIT capsule Take 1,000 Units by mouth daily.         OBJECTIVE:   Vital Signs: BP 124/72 (BP Location: Left Arm, Patient Position: Sitting, Cuff Size: Large)   Pulse 76   Ht 5\' 2"  (1.575 m)   Wt 224 lb (101.6 kg)   SpO2 95%   BMI 40.97 kg/m   Wt Readings from Last 3 Encounters:  07/03/23 224 lb (101.6 kg)  07/03/23 223 lb 9.6 oz (101.4 kg)  05/01/23 220 lb 9.6 oz (100.1 kg)     Exam: General: Pt appears well and is in NAD  Lungs: Clear with good BS bilat   Heart: RRR   Extremities: No pretibial edema.   Neuro: MS is good with appropriate affect, pt is alert and Ox3   DM foot exam: 12/19/2022   The skin of the feet is intact without sores or ulcerations. The pedal pulses are 2+ on right and 2+ on left. The sensation is intact to a screening 5.07, 10 gram monofilament bilaterally     DATA REVIEWED:  Lab Results  Component Value Date   HGBA1C 7.0 (A) 07/03/2023   HGBA1C 7.2 (A) 12/19/2022   HGBA1C 6.8 (H) 05/16/2022      Latest Reference Range & Units 07/03/23 14:40  Sodium 135 - 145 mEq/L 140  Potassium 3.5 - 5.1 mEq/L 4.6  Chloride 96 - 112 mEq/L 102  CO2 19 - 32 mEq/L 17 (L)  Glucose  70 - 99 mg/dL 130 (H)  BUN 6 - 23 mg/dL 20  Creatinine 8.65 - 7.84 mg/dL 6.96  Calcium 8.4 - 29.5 mg/dL 9.6  Alkaline Phosphatase 39 - 117 U/L 68  Albumin 3.5 - 5.2 g/dL 4.0  AST 0 - 37 U/L 47 (H)  ALT 0 - 35 U/L 46 (H)  Total Protein 6.0 - 8.3 g/dL 6.9  Total Bilirubin 0.2 - 1.2 mg/dL 0.5  GGT 7 - 51 U/L 60 (H)  GFR >60.00 mL/min 44.37 (L)  Pro B Natriuretic peptide (BNP) 0.0 - 100.0 pg/mL 35.0    Latest Reference Range & Units 07/03/23 14:40  VITD 30.00 - 100.00 ng/mL 42.33     Latest Reference Range & Units 05/01/23 11:07  Sodium 135 - 145 mEq/L 142  Potassium 3.5 - 5.1 mEq/L 4.3  Chloride 96 - 112 mEq/L 102  CO2 19 - 32 mEq/L 28  Glucose 70 - 99 mg/dL 95  BUN 6 - 23 mg/dL 15  Creatinine 2.84 - 1.32 mg/dL 4.40  Calcium 8.4 - 10.2 mg/dL 72.5  Alkaline Phosphatase 39 - 117 U/L 67  Albumin 3.5 - 5.2 g/dL 4.3  AST 0 - 37 U/L 57 (H)  ALT 0 - 35 U/L 50 (H)  Total Protein 6.0 - 8.3 g/dL 7.3  Total Bilirubin 0.2 - 1.2 mg/dL 0.6  GFR >36.64 mL/min 46.81 (L)    ASSESSMENT / PLAN / RECOMMENDATIONS:   1) Type 2 Diabetes Mellitus, Optimally controlled, Without complications - Most recent A1c of 7.0  %. Goal A1c < 7.0 %.    -A1c optimal -No glucose data today -Per patient, cardiology recommended SGLT2 inhibitors, she does understand the risk of genital infections, she is under the impression that this will replace Ozempic, I  did discuss cardiovascular and renal benefits of GLP-1 agonist as well as SGLT2 inhibitors and I have recommended the Jardiance to be an add-on at this time, we will consider weaning her off glipizide in the future -Intolerant to higher dose of Ozempic, even though I am perplexed that the diarrhea happen a year and a half into increasing the Ozempic -Due to a GFR < 45, will decrease metformin by 50% as below   MEDICATIONS: Continue glipizide 5 mg daily  Decrease  metformin 500 mg, 1 tablets twice daily Continue Ozempic  0.5 mg weekly Start Jardiance 10 mg  daily  EDUCATION / INSTRUCTIONS: BG monitoring instructions: Patient is instructed to check her blood sugars 1 times a day, fsting Call [place] [facility] if: BG persistently < 70  I reviewed the Rule of 15 for the treatment of hypoglycemia in detail with the patient. Literature supplied.   2) Diabetic complications:  Eye: Does not have known diabetic retinopathy.  Neuro/ Feet: Does not have known diabetic peripheral neuropathy .  Renal: Patient does not have known baseline CKD. She   is intolerant to  ACEI/ARB     3)SOB:  - BNP ordered -Was also normal, this was forwarded to cardiology  4) Elevated LFTs:  -Infectious versus fatty liver.  She has had elevated LFTs 2 years ago -Repeat LFTs today remain elevated but trending down -GGT elevated -Will refer to GI for further evaluation   F/U in 6 months   Signed electronically by: Lyndle Herrlich, MD  [facility]  [facility] [address] [place], [place] [address] Phone: [phone] FAX: [phone]   CC: Philip Aspen, Limmie Patricia, MD [address] [place] [address] Phone: [phone]  Fax: [phone]  Return to Endocrinology clinic as below: No future appointments.

## 2023-07-03 NOTE — Patient Instructions (Signed)
-   Start Jardiance 10mg , 1 table every morning  - Continue  Ozempic 0.5 mg once weekly  - Continue  Glipizide 5 mg, 1 tablet before Breakfast  - Continue Metformin 500 mg , 2 tablets with Breakfast and 2 tablet with supper     HOW TO TREAT LOW BLOOD SUGARS (Blood sugar LESS THAN 70 MG/DL) Please follow the RULE OF 15 for the treatment of hypoglycemia treatment (when your (blood sugars are less than 70 mg/dL)   STEP 1: Take 15 grams of carbohydrates when your blood sugar is low, which includes:  3-4 GLUCOSE TABS  OR 3-4 OZ OF JUICE OR REGULAR SODA OR ONE TUBE OF GLUCOSE GEL    STEP 2: RECHECK blood sugar in 15 MINUTES STEP 3: If your blood sugar is still low at the 15 minute recheck --> then, go back to STEP 1 and treat AGAIN with another 15 grams of carbohydrates.

## 2023-07-03 NOTE — Progress Notes (Signed)
Cardiology Office Note:    Date:  07/03/2023   ID:  Diane Decker, DOB Jun 16, 1946, MRN [id number]  PCP:  Philip Aspen, Limmie Patricia, MD    HeartCare Providers Cardiologist:  None     Referring MD: Philip Aspen, Estel*   Chief Complaint  Patient presents with   Consult  Diane Decker is a 77 y.o. female who is being seen today for the evaluation of dyspnea at the request of Philip Aspen, Almira Bar*.   History of Present Illness:    Diane Decker is a 77 y.o. female with a hx of hypertension, type 2 diabetes mellitus, morbid obesity, referred for complaints of exertional dyspnea.  She only develops shortness of breath when she has to take her garbage can up the hill towards her house.  She describes her driveway as being "very steep".  She does not have any difficulty with gardening activities or household chores.  She denies any exertional chest discomfort.  She does not have orthopnea, PND and has only mild lower extremity edema.  She has fairly well-controlled diabetes mellitus with a hemoglobin A1c of 7.2% but wants to stop taking Ozempic due to side effects.  She plans to discuss this with her endocrinologist later today.  She is also taking glipizide and metformin.  In the past she has taken Januvia and had some type of side effects, but she has never taken an SGLT2 inhibitor such as [place] or [place].  Blood pressure control is consistently good.  Her most recent lipid profile shows an LDL cholesterol of 37 and HDL of 57 with borderline elevated triglycerides.  She does not smoke.  She is borderline morbidly obese with a BMI over 40.  An echocardiogram performed 06/04/2023 Showed LVEF 55-60%, normal regional wall motion and normal diastolic function.  The right heart chambers also appeared normal and there was no evidence of pulmonary hypertension or any valvular abnormalities.  Past Medical History:  Diagnosis Date    Allergy    Arthritis    Barrett's esophagus    Cancer (HCC)    skin cancer   Chicken pox    Depression    Diabetes mellitus without complication (HCC)    Family history of polyps in the colon    GERD (gastroesophageal reflux disease)    Hypertension    Osteopenia    Sleep apnea    borderline- no cpap use     Past Surgical History:  Procedure Laterality Date   ABDOMINAL HYSTERECTOMY     BREAST BIOPSY  2007   COLONOSCOPY     KNEE ARTHROSCOPY Left    ~15 yrs ago    POLYPECTOMY     TONSILLECTOMY  1949   UPPER GASTROINTESTINAL ENDOSCOPY      Current Medications: Current Meds  Medication Sig   Accu-Chek Softclix Lancets lancets Use once daily Dx E11.9   aspirin 81 MG chewable tablet Chew by mouth daily.   atenolol (TENORMIN) 50 MG tablet TAKE 1 TABLET BY MOUTH EVERY DAY   atorvastatin (LIPITOR) 20 MG tablet TAKE 1 TABLET BY MOUTH EVERY DAY   Blood Glucose Monitoring Suppl (ACCU-CHEK GUIDE ME) w/Device KIT Use daily for glucose control . Dx E11.9   cycloSPORINE (RESTASIS) 0.05 % ophthalmic emulsion 1 drop 2 (two) times daily.   esomeprazole (NEXIUM) 20 MG capsule Take 20 mg by mouth daily at 6 (six) AM.   glipiZIDE (GLUCOTROL) 5 MG tablet Take 0.5 tablets (2.5 mg total) by mouth 2 (two) times daily before  a meal.   glucose blood (ACCU-CHEK GUIDE) test strip Test once daily for glucose control.  Dx E11.9   hydrochlorothiazide (HYDRODIURIL) 25 MG tablet TAKE 1 TABLET (25 MG TOTAL) BY MOUTH DAILY.   metFORMIN (GLUCOPHAGE) 500 MG tablet Take 2 tablets (1,000 mg total) by mouth 2 (two) times daily with a meal.   Multiple Vitamin (MULTIVITAMIN) capsule Take 1 capsule by mouth daily.   Semaglutide,0.25 or 0.5MG /DOS, 2 MG/3ML SOPN Inject 0.5 mg into the skin once a week.   traZODone (DESYREL) 150 MG tablet TAKE 1/2 TABLET IN THE EVENING   Vitamin D, Ergocalciferol, (DRISDOL) 1.25 MG (50000 UNIT) CAPS capsule Take 1 capsule (50,000 Units total) by mouth every 7 (seven) days for 12 doses.    vitamin E 1000 UNIT capsule Take 1,000 Units by mouth daily.     Allergies:   Penicillins, Whey protein [protein], Lisinopril, Losartan potassium, and Losartan   Social History   Socioeconomic History   Marital status: Widowed    Spouse name: Not on file   Number of children: Not on file   Years of education: Not on file   Highest education level: Bachelor's degree (e.g., BA, AB, BS)  Occupational History   Not on file  Tobacco Use   Smoking status: Former   Smokeless tobacco: Never   Tobacco comments:    long ago-   Vaping Use   Vaping status: Never Used  Substance and Sexual Activity   Alcohol use: Never   Drug use: Never   Sexual activity: Not on file  Other Topics Concern   Not on file  Social History Narrative   Not on file   Social Determinants of Health   Financial Resource Strain: Not on file  Food Insecurity: No Food Insecurity (05/01/2023)   Hunger Vital Sign    Worried About Running Out of Food in the Last Year: Never true    Ran Out of Food in the Last Year: Never true  Transportation Needs: No Transportation Needs (05/01/2023)   PRAPARE - Administrator, Civil Service (Medical): No    Lack of Transportation (Non-Medical): No  Physical Activity: Insufficiently Active (05/01/2023)   Exercise Vital Sign    Days of Exercise per Week: 2 days    Minutes of Exercise per Session: 60 min  Stress: No Stress Concern Present (05/01/2023)   [employer] of Occupational Health - Occupational Stress Questionnaire    Feeling of Stress : Only a little  Social Connections: Socially Isolated (05/01/2023)   Social Connection and Isolation Panel [NHANES]    Frequency of Communication with Friends and Family: Never    Frequency of Social Gatherings with Friends and Family: Never    Attends Religious Services: Never    Database administrator or Organizations: No    Attends Engineer, structural: Not on file    Marital Status: Widowed     Family  History: The patient's family history includes Alcohol abuse in her brother, maternal grandfather, and paternal grandfather; Arthritis in her brother, maternal grandfather, maternal grandmother, paternal grandfather, paternal grandmother, and sister; Breast cancer in her mother; COPD in her daughter; Cancer in her daughter and maternal grandfather; Depression in her brother, maternal grandmother, sister, and son; Diabetes in her paternal grandfather; Hearing loss in her paternal grandfather; Heart disease in her paternal grandfather; Hyperlipidemia in her brother, maternal grandfather, maternal grandmother, paternal grandfather, paternal grandmother, and sister; Hypertension in her brother, maternal grandfather, maternal grandmother, paternal grandmother, and sister;  Liver cancer in her father; Lung cancer in her father; Stroke in her maternal grandfather, maternal grandmother, and paternal grandfather. There is no history of Colon polyps, Esophageal cancer, Rectal cancer, or Stomach cancer.  ROS:   Please see the history of present illness.     All other systems reviewed and are negative.  EKGs/Labs/Other Studies Reviewed:    The following studies were reviewed today:  EKG Interpretation Date/Time:  Wednesday July 03 2023 09:44:08 EDT Ventricular Rate:  74 PR Interval:  162 QRS Duration:  76 QT Interval:  392 QTC Calculation: 435 R Axis:   28  Text Interpretation: Normal sinus rhythm Normal ECG No previous ECGs available Confirmed by Seri Kimmer ([id number]) on 07/03/2023 10:11:07 AM    Recent Labs: 05/01/2023: ALT 50; BUN 15; Creatinine, Ser 1.12; Hemoglobin 12.8; Platelets 232.0; Potassium 4.3; Sodium 142; TSH 1.76  Recent Lipid Panel    Component Value Date/Time   CHOL 126 05/16/2022 1009   TRIG 159.0 (H) 05/16/2022 1009   HDL 57.00 05/16/2022 1009   CHOLHDL 2 05/16/2022 1009   VLDL 31.8 05/16/2022 1009   LDLCALC 37 05/16/2022 1009     Risk Assessment/Calculations:                 Physical Exam:    VS:  BP 114/66 (BP Location: Left Arm, Patient Position: Sitting, Cuff Size: Large)   Pulse 74   Ht 5\' 2"  (1.575 m)   Wt 223 lb 9.6 oz (101.4 kg)   SpO2 95%   BMI 40.90 kg/m     Wt Readings from Last 3 Encounters:  07/03/23 223 lb 9.6 oz (101.4 kg)  05/01/23 220 lb 9.6 oz (100.1 kg)  01/02/23 225 lb 12.8 oz (102.4 kg)     GEN: Morbidly obese, well nourished, well developed in no acute distress HEENT: Normal NECK: No JVD; No carotid bruits LYMPHATICS: No lymphadenopathy CARDIAC: RRR, no murmurs, rubs, gallops RESPIRATORY:  Clear to auscultation without rales, wheezing or rhonchi  ABDOMEN: Soft, non-tender, non-distended MUSCULOSKELETAL:  No edema; No deformity  SKIN: Warm and dry NEUROLOGIC:  Alert and oriented x 3 PSYCHIATRIC:  Normal affect   ASSESSMENT:    1. DOE (dyspnea on exertion)   2. Swelling    PLAN:    In order of problems listed above:  Exertional dyspnea: Reviewed the echocardiogram.  Indeed there appeared to be completely normal systolic and diastolic parameters.  My suspicion is that her dyspnea is not related to heart disease, but rather to her weight.  She does not have angina, ECG is normal and (although she does have coronary risk factors) there is nothing else to indicate underlying coronary disease.  I advise checking a BNP next time she has any blood work done (probably this afternoon).  If the BNP is also normal I do not think additional cardiac workup is indicated.  If symptoms worsen and dyspnea is unexplained, it would be reasonable to perform a coronary CT angio to look for coronary artery blockages. DM2: Control is good but she wants to stop taking Ozempic due to side effects.  She will discuss this with Dr. Lonzo Cloud later today.  I would recommend using either Jardiance or Farxiga for the improved benefit on cardiovascular outcomes.  It would be interesting to see if her dyspnea improves at all if she takes either 1 of  these medications. HTN: Well-controlled on the current medications.  If she does start taking SGLT2 inhibitor she may need to cut back of the  dose of hydrochlorothiazide to avoid hypovolemia. HLP: All lipid parameters are excellent with the exception of very mild hypertriglyceridemia.  Continue atorvastatin.           Medication Adjustments/Labs and Tests Ordered: Current medicines are reviewed at length with the patient today.  Concerns regarding medicines are outlined above.  Orders Placed This Encounter  Procedures   Brain natriuretic peptide   EKG 12-Lead   No orders of the defined types were placed in this encounter.   Patient Instructions   Medication Instructions:  No changes *If you need a refill on your cardiac medications before your next appointment, please call your pharmacy*   Lab Work: BNP- either at Endocrinologist or you can come back to our lab (or any [facility]) If you have labs (blood work) drawn today and your tests are completely normal, you will receive your results only by: MyChart Message (if you have MyChart) OR A paper copy in the mail If you have any lab test that is abnormal or we need to change your treatment, we will call you to review the results.   Follow-Up: At [facility], you and your health needs are our priority.  As part of our continuing mission to provide you with exceptional heart care, we have created designated Provider Care Teams.  These Care Teams include your primary Cardiologist (physician) and Advanced Practice Providers (APPs -  Physician Assistants and Nurse Practitioners) who all work together to provide you with the care you need, when you need it.  We recommend signing up for the patient portal called "MyChart".  Sign up information is provided on this After Visit Summary.  MyChart is used to connect with patients for Virtual Visits (Telemedicine).  Patients are able to view lab/test results, encounter notes, upcoming  appointments, etc.  Non-urgent messages can be sent to your provider as well.   To learn more about what you can do with MyChart, go to [email or web address].    Your next appointment:    Follow up as needed  Provider:   Dr Royann Shivers     Signed, Thurmon Fair, MD  07/03/2023 1:58 PM    [facility]

## 2023-07-03 NOTE — Patient Instructions (Signed)
Medication Instructions:  No changes *If you need a refill on your cardiac medications before your next appointment, please call your pharmacy*   Lab Work: BNP- either at Endocrinologist or you can come back to our lab (or any [facility]) If you have labs (blood work) drawn today and your tests are completely normal, you will receive your results only by: MyChart Message (if you have MyChart) OR A paper copy in the mail If you have any lab test that is abnormal or we need to change your treatment, we will call you to review the results.   Follow-Up: At [facility], you and your health needs are our priority.  As part of our continuing mission to provide you with exceptional heart care, we have created designated Provider Care Teams.  These Care Teams include your primary Cardiologist (physician) and Advanced Practice Providers (APPs -  Physician Assistants and Nurse Practitioners) who all work together to provide you with the care you need, when you need it.  We recommend signing up for the patient portal called "MyChart".  Sign up information is provided on this After Visit Summary.  MyChart is used to connect with patients for Virtual Visits (Telemedicine).  Patients are able to view lab/test results, encounter notes, upcoming appointments, etc.  Non-urgent messages can be sent to your provider as well.   To learn more about what you can do with MyChart, go to [email or web address].    Your next appointment:    Follow up as needed  Provider:   Dr Royann Shivers

## 2023-07-04 LAB — COMPREHENSIVE METABOLIC PANEL
ALT: 46 U/L — ABNORMAL HIGH (ref 0–35)
AST: 47 U/L — ABNORMAL HIGH (ref 0–37)
Albumin: 4 g/dL (ref 3.5–5.2)
Alkaline Phosphatase: 68 U/L (ref 39–117)
BUN: 20 mg/dL (ref 6–23)
CO2: 17 meq/L — ABNORMAL LOW (ref 19–32)
Calcium: 9.6 mg/dL (ref 8.4–10.5)
Chloride: 102 meq/L (ref 96–112)
Creatinine, Ser: 1.17 mg/dL (ref 0.40–1.20)
GFR: 44.37 mL/min — ABNORMAL LOW (ref >60.00)
Glucose, Bld: 135 mg/dL — ABNORMAL HIGH (ref 70–99)
Potassium: 4.6 meq/L (ref 3.5–5.1)
Sodium: 140 meq/L (ref 135–145)
Total Bilirubin: 0.5 mg/dL (ref 0.2–1.2)
Total Protein: 6.9 g/dL (ref 6.0–8.3)

## 2023-07-04 LAB — GAMMA GT: GGT: 60 U/L — ABNORMAL HIGH (ref 7–51)

## 2023-07-04 LAB — BRAIN NATRIURETIC PEPTIDE: Pro B Natriuretic peptide (BNP): 35 pg/mL (ref 0.0–100.0)

## 2023-07-04 LAB — VITAMIN D 25 HYDROXY (VIT D DEFICIENCY, FRACTURES): VITD: 42.33 ng/mL (ref 30.00–100.00)

## 2023-07-05 ENCOUNTER — Other Ambulatory Visit: Payer: Self-pay | Admitting: Internal Medicine

## 2023-07-05 ENCOUNTER — Telehealth: Payer: Self-pay | Admitting: Internal Medicine

## 2023-07-05 MED ORDER — METFORMIN HCL 500 MG PO TABS
500.0000 mg | ORAL_TABLET | Freq: Two times a day (BID) | ORAL | 3 refills | Status: DC
Start: 2023-07-05 — End: 2024-01-01

## 2023-07-05 NOTE — Telephone Encounter (Signed)
Patient aware and advise that she can't afford the Jardiance. Would like to just continue with her other medications for now.

## 2023-07-05 NOTE — Telephone Encounter (Addendum)
Please let the patient know that her liver enzymes continue to be elevated, they are slowly coming down but remain elevated, it appears that she has inflammation of the liver   I have referred her to gastroenterology as this is beyond the scope of endocrinology   Her heart test is good and I have forwarded those results to her heart doctor  Also let her know that her kidney function remains low, and based on her numbers we need to decrease metformin to a maximum of 2 tablets a day (instead of 4) she will take 1 tablet before breakfast and 1 tablet before supper  Thanks

## 2023-07-16 ENCOUNTER — Telehealth: Payer: Self-pay | Admitting: Gastroenterology

## 2023-07-16 NOTE — Telephone Encounter (Signed)
Dr. Adela Lank, will you please review labs and determine if appt in January is appropriate for patient? Thanks

## 2023-07-16 NOTE — Telephone Encounter (Signed)
Inbound call fro patient requesting a f/u call from nurse in regards to her having a referral for elevated LFT. Patient is scheduled for next available ov    Please advise. Thank you

## 2023-07-16 NOTE — Telephone Encounter (Signed)
Attempted to reach patient at her listed phone number twice. The line rings then goes to a fast busy signal, no option to leave a vm at this time. MyChart message sent to patient as well.

## 2023-07-16 NOTE — Telephone Encounter (Signed)
I think January is fine, this is a very mild elevation. PCP can trend and repeat in another month or so in the interim, if rising or worsening we can see sooner, but this appears to be ongoing for several months and ALT is in the 40s, just a barely above normal. Thanks

## 2023-07-17 NOTE — Telephone Encounter (Signed)
2nd attempt to reach patient. Phone rings and then goes to a fast busy signal. Will await further communication from patient if she has continued concerns.

## 2023-07-25 ENCOUNTER — Other Ambulatory Visit: Payer: Self-pay | Admitting: Internal Medicine

## 2023-07-25 DIAGNOSIS — E559 Vitamin D deficiency, unspecified: Secondary | ICD-10-CM

## 2023-07-27 ENCOUNTER — Other Ambulatory Visit: Payer: Self-pay | Admitting: Internal Medicine

## 2023-07-29 NOTE — Telephone Encounter (Signed)
Pt needs to schedule a CPE for further refills 

## 2023-07-30 DIAGNOSIS — Z85828 Personal history of other malignant neoplasm of skin: Secondary | ICD-10-CM | POA: Diagnosis not present

## 2023-07-30 DIAGNOSIS — C4441 Basal cell carcinoma of skin of scalp and neck: Secondary | ICD-10-CM | POA: Diagnosis not present

## 2023-08-22 ENCOUNTER — Encounter: Payer: Self-pay | Admitting: Family Medicine

## 2023-08-22 ENCOUNTER — Ambulatory Visit (INDEPENDENT_AMBULATORY_CARE_PROVIDER_SITE_OTHER): Payer: Medicare PPO

## 2023-08-22 ENCOUNTER — Ambulatory Visit: Payer: Medicare PPO | Admitting: Family Medicine

## 2023-08-22 VITALS — BP 132/82 | HR 65 | Ht 62.0 in | Wt 228.0 lb

## 2023-08-22 DIAGNOSIS — M79672 Pain in left foot: Secondary | ICD-10-CM

## 2023-08-22 DIAGNOSIS — M7732 Calcaneal spur, left foot: Secondary | ICD-10-CM | POA: Diagnosis not present

## 2023-08-22 DIAGNOSIS — S99922A Unspecified injury of left foot, initial encounter: Secondary | ICD-10-CM | POA: Diagnosis not present

## 2023-08-22 DIAGNOSIS — M19072 Primary osteoarthritis, left ankle and foot: Secondary | ICD-10-CM | POA: Diagnosis not present

## 2023-08-22 NOTE — Progress Notes (Signed)
   I, Stevenson Clinch, CMA acting as a scribe for Diane Graham, MD.  Diane Decker is a 77 y.o. female who presents to [facility] at [facility] today for L foot pain. Pt was previously seen by Dr. Denyse Amass on 01/02/23 for R shoulder pain.  Today, pt c/o L foot pain x 1 week. FUX:NATFTDD foot while using shovel to plant a shrub. Pt locates pain to lateral aspect. Denies ankle pain. Has tried soaking, [place], elevation, rest. Denies swelling or bruising. Sx worse with WB and ambulation.   Swelling: no Aggravates: WB, ambulation Treatments tried: IBU  Pertinent review of systems: No fevers or chills  Relevant historical information: Hypertension and diabetes.  Osteoporosis.   Exam:  BP 132/82   Pulse 65   Ht 5\' 2"  (1.575 m)   Wt 228 lb (103.4 kg)   SpO2 95%   BMI 41.70 kg/m  General: Well Developed, well nourished, and in no acute distress.   MSK: Left foot and ankle are normal-appearing Mildly tender palpation dorsal lateral midfoot. Intact strength. Stable ligamentous exam.    Lab and Radiology Results  X-ray images left foot obtained today personally and independently interpreted. No acute fractures are present in the region of pain around the cuboid.  No severe degenerative changes are present at the cuboid either. Await formal radiology review.     Assessment and Plan: 77 y.o. female with left lateral midfoot pain after stepping on a shovel or gardening.  Etiology is unclear.  Suspect strain or sprain of the midfoot.  There could be a radiographically occult fracture.  Plan for immobilization with postop shoe and physical therapy.  Recheck in 1 month.   PDMP not reviewed this encounter. Orders Placed This Encounter  Procedures   DG Foot Complete Left    Standing Status:   Future    Number of Occurrences:   1    Standing Expiration Date:   08/21/2024    Order Specific Question:   Reason for Exam (SYMPTOM  OR DIAGNOSIS REQUIRED)     Answer:   left foot injury    Order Specific Question:   Preferred imaging location?    Answer:   [facility]   Ambulatory referral to Physical Therapy    Referral Priority:   Routine    Referral Type:   Physical Medicine    Referral Reason:   Specialty Services Required    Requested Specialty:   Physical Therapy    Number of Visits Requested:   1   No orders of the defined types were placed in this encounter.    Discussed warning signs or symptoms. Please see discharge instructions. Patient expresses understanding.   The above documentation has been reviewed and is accurate and complete Diane Decker, M.D.

## 2023-08-22 NOTE — Patient Instructions (Addendum)
Thank you for coming in today.  I've referred you to Physical Therapy.  Let us know if you don't hear from them in one week.   Use a post op shoe.    Please go to [facility] supply to get the post op shoe we talked about today. You may also be able to get it from [employer].    As you feel better ok to wean into a regular shoe.   Recheck in about 1 month.

## 2023-09-16 NOTE — Progress Notes (Signed)
Left foot x-ray shows arthritis.

## 2023-09-21 ENCOUNTER — Other Ambulatory Visit: Payer: Self-pay | Admitting: Internal Medicine

## 2023-09-25 NOTE — Progress Notes (Unsigned)
Rubin Payor, PhD, LAT, ATC acting as a scribe for [name], MD.  Diane Decker is a 77 y.o. female who presents to [facility] at [facility] today for f/u L foot pain. Pt was last seen by Dr. Denyse Amass on 08/22/23 and was advised to wear a post-op shoe and was referred to PT, but she never scheduled any visits.  Today, pt reports she wore the post-op shoe for 3 weeks and her L foot had resolved. Last week, she was walking around barefoot, and now is having severe pain in her L forefoot. Doesn't recall any injury or stepping on anything. Pain is located along the heads of the 1st-2nd MT and into Great toe. Redness present.   Dx imaging: 08/22/23 L foot XR  Pertinent review of systems: No fevers or chills  Relevant historical information: No prior history of gout   Exam:  BP 134/76   Pulse 71   Ht 5\' 2"  (1.575 m)   Wt 230 lb (104.3 kg)   SpO2 97%   BMI 42.07 kg/m  General: Well Developed, well nourished, and in no acute distress.   MSK: Left foot swelling and redness around the great toe.  Tender to palpation decreased toe motion.  Capillary fill and sensation and pulses are intact distally.    Lab and Radiology Results  X-ray images left foot obtained today personally and independently interpreted No acute fractures.  Rounded cortical fragments present medial to the first MTP are present accessory ossicle or old injury.  Mild first MTP DJD.  No acute fractures are visible. Await formal radiology review    Assessment and Plan: 77 y.o. female with left great toe pain and swelling without injury.  This is consistent with a gout flare.  Plan to treat empirically with hydrocodone and colchicine.  Will check uric acid and metabolic panel as well and anticipate starting allopurinol.  We talked about the mild drug interaction between colchicine and atorvastatin.  She is on low doses of both of these medicines and has never had problems with statins.   The benefit is worth the minimal risk.  Recheck in a month or return sooner if needed.  Use a postop shoe to ambulate.   PDMP reviewed during this encounter. Orders Placed This Encounter  Procedures   DG Foot Complete Left    Standing Status:   Future    Number of Occurrences:   1    Standing Expiration Date:   09/25/2024    Order Specific Question:   Reason for Exam (SYMPTOM  OR DIAGNOSIS REQUIRED)    Answer:   left foot pain    Order Specific Question:   Preferred imaging location?    Answer:   [facility]   Uric acid    Standing Status:   Future    Number of Occurrences:   1    Standing Expiration Date:   09/25/2024   Comprehensive metabolic panel    Standing Status:   Future    Number of Occurrences:   1    Standing Expiration Date:   09/25/2024   Meds ordered this encounter  Medications   colchicine 0.6 MG tablet    Sig: Take 0.5 tablets (0.3 mg total) by mouth daily as needed (gout or psuedogout pain).    Dispense:  30 tablet    Refill:  2   HYDROcodone-acetaminophen (NORCO/VICODIN) 5-325 MG tablet    Sig: Take 1 tablet by mouth every 6 (six) hours  as needed.    Dispense:  15 tablet    Refill:  0     Discussed warning signs or symptoms. Please see discharge instructions. Patient expresses understanding.   The above documentation has been reviewed and is accurate and complete [name], M.D.

## 2023-09-26 ENCOUNTER — Ambulatory Visit (INDEPENDENT_AMBULATORY_CARE_PROVIDER_SITE_OTHER): Payer: Medicare PPO

## 2023-09-26 ENCOUNTER — Ambulatory Visit: Payer: Medicare PPO | Admitting: Family Medicine

## 2023-09-26 VITALS — BP 134/76 | HR 71 | Ht 62.0 in | Wt 230.0 lb

## 2023-09-26 DIAGNOSIS — M7732 Calcaneal spur, left foot: Secondary | ICD-10-CM | POA: Diagnosis not present

## 2023-09-26 DIAGNOSIS — M7989 Other specified soft tissue disorders: Secondary | ICD-10-CM | POA: Diagnosis not present

## 2023-09-26 DIAGNOSIS — M19072 Primary osteoarthritis, left ankle and foot: Secondary | ICD-10-CM | POA: Diagnosis not present

## 2023-09-26 DIAGNOSIS — M79672 Pain in left foot: Secondary | ICD-10-CM | POA: Diagnosis not present

## 2023-09-26 LAB — COMPREHENSIVE METABOLIC PANEL
ALT: 20 U/L (ref 0–35)
AST: 19 U/L (ref 0–37)
Albumin: 4.1 g/dL (ref 3.5–5.2)
Alkaline Phosphatase: 79 U/L (ref 39–117)
BUN: 15 mg/dL (ref 6–23)
CO2: 28 meq/L (ref 19–32)
Calcium: 9.6 mg/dL (ref 8.4–10.5)
Chloride: 102 meq/L (ref 96–112)
Creatinine, Ser: 1.03 mg/dL (ref 0.40–1.20)
GFR: 51.62 mL/min — ABNORMAL LOW (ref >60.00)
Glucose, Bld: 189 mg/dL — ABNORMAL HIGH (ref 70–99)
Potassium: 4.1 meq/L (ref 3.5–5.1)
Sodium: 139 meq/L (ref 135–145)
Total Bilirubin: 0.6 mg/dL (ref 0.2–1.2)
Total Protein: 7.4 g/dL (ref 6.0–8.3)

## 2023-09-26 LAB — URIC ACID: Uric Acid, Serum: 7.1 mg/dL — ABNORMAL HIGH (ref 2.4–7.0)

## 2023-09-26 MED ORDER — COLCHICINE 0.6 MG PO TABS: 0.3000 mg | ORAL_TABLET | Freq: Every day | ORAL | 2 refills | Status: AC | PRN

## 2023-09-26 MED ORDER — HYDROCODONE-ACETAMINOPHEN 5-325 MG PO TABS: 1.0000 | ORAL_TABLET | Freq: Four times a day (QID) | ORAL | 0 refills | Status: AC | PRN

## 2023-09-26 NOTE — Patient Instructions (Addendum)
Thank you for coming in today.   Please get labs today before you leave   Please get an Xray today before you leave   Take the colchicine daily now.   Use the hydrocodone as needed.   Recheck in 1 month.   Return sooner or let me know if this is not working.

## 2023-09-27 MED ORDER — ALLOPURINOL 100 MG PO TABS: 100.0000 mg | ORAL_TABLET | Freq: Every day | ORAL | 3 refills | Status: DC

## 2023-09-27 NOTE — Addendum Note (Signed)
Addended by: Rodolph Bong on: 09/27/2023 07:36 AM   Modules accepted: Orders

## 2023-09-27 NOTE — Progress Notes (Signed)
Uric acid is elevated making gout very likely.  Please start the colchicine that I prescribed yesterday.  I just prescribed allopurinol which you should take daily to reduce uric acid.  Recheck in 1 month as scheduled.

## 2023-10-07 NOTE — Progress Notes (Signed)
Left foot x-ray shows some swelling on the top of the foot.  No fractures or significant arthritis are visible.

## 2023-10-24 ENCOUNTER — Telehealth: Payer: Self-pay | Admitting: Internal Medicine

## 2023-10-24 NOTE — Telephone Encounter (Signed)
 Please disregard previous message.  Provider will have Medicare AWV with Pt, after all.

## 2023-10-24 NOTE — Telephone Encounter (Signed)
 FYI: Called Pt at 9:40 am on 10/24/23 to reschedule.  LVM on [phone] (IC)

## 2023-10-25 NOTE — Progress Notes (Deleted)
   LILLETTE Ileana Collet, PhD, LAT, ATC acting as a scribe for Artist Lloyd, MD.  Diane Decker is a 78 y.o. female who presents to [facility] at [facility] today for f/u L foot pain. Pt was last seen by Dr. Lloyd on 09/26/23 and labs were obtained. Also prescribed hydrocodone  and cont post-op shoe. Based on lab results, she was advised to start colchicine .   Today, pt reports ***  Dx testing: 09/26/23 L foot XR & labs 08/22/23 L foot XR   Pertinent review of systems: ***  Relevant historical information: ***   Exam:  There were no vitals taken for this visit. General: Well Developed, well nourished, and in no acute distress.   MSK: ***    Lab and Radiology Results No results found for this or any previous visit (from the past 72 hours). No results found.     Assessment and Plan: 78 y.o. female with ***   PDMP not reviewed this encounter. No orders of the defined types were placed in this encounter.  No orders of the defined types were placed in this encounter.    Discussed warning signs or symptoms. Please see discharge instructions. Patient expresses understanding.   ***

## 2023-10-27 ENCOUNTER — Other Ambulatory Visit: Payer: Self-pay | Admitting: Internal Medicine

## 2023-10-28 ENCOUNTER — Ambulatory Visit: Payer: Medicare PPO | Admitting: Family Medicine

## 2023-10-31 ENCOUNTER — Encounter: Payer: Self-pay | Admitting: Family Medicine

## 2023-10-31 ENCOUNTER — Ambulatory Visit (INDEPENDENT_AMBULATORY_CARE_PROVIDER_SITE_OTHER): Payer: Medicare PPO | Admitting: Family Medicine

## 2023-10-31 DIAGNOSIS — Z0000 Encounter for general adult medical examination without abnormal findings: Secondary | ICD-10-CM

## 2023-10-31 NOTE — Progress Notes (Signed)
 Patient unable to obtain vital signs due to virtual visit

## 2023-10-31 NOTE — Progress Notes (Signed)
 PATIENT CHECK-IN and HEALTH RISK ASSESSMENT QUESTIONNAIRE:  -completed by phone/video for upcoming Medicare Preventive Visit  Pre-Visit Check-in: 1)Vitals (height, wt, BP, etc) - record in vitals section for visit on day of visit Request home vitals (wt, BP, etc.) and enter into vitals, THEN update Vital Signs SmartPhrase below at the top of the HPI. See below.  2)Review and Update Medications, Allergies PMH, Surgeries, Social history in Epic 3)Hospitalizations in the last year with date/reason? No  4)Review and Update Care Team (patient's specialists) in Epic 5) Complete PHQ9 in Epic  6) Complete Fall Screening in Epic 7)Review all Health Maintenance Due and order under PCP if not done.  Medicare Wellness Patient Questionnaire:  Answer theses question about your habits: How often do you have a drink containing alcohol?very rarely has a small corona Have you ever smoked?Yes Quit date if applicable?  50 years   How many packs a day do/did you smoke?  Less than 1  Do you use smokeless tobacco? No Do you use an illicit drugs? No On average, how many days per week do you engage in moderate to strenuous exercise (like a brisk walk)? Does water aerobic twice a week - for 1 hour, does do outdoor lawn work quite a bit two Are you sexually active? No Number of partners?N/A Typical breakfast: Varies  Typical lunch: Varies  Typical dinner: Varies  Typical snacks: Fruit, cheese it,   Beverages: Soft drinks  Answer theses question about your everyday activities: Can you perform most household chores? Yes  Are you deaf or have significant trouble hearing? Depends Do you feel that you have a problem with memory? Yes  Do you feel safe at home? Yes  Last dentist visit? 6 months  8. Do you have any difficulty performing your everyday activities?No Are you having any difficulty walking, taking medications on your own, and or difficulty managing daily home needs?  No Do you have difficulty walking  or climbing stairs? No Do you have difficulty dressing or bathing? No Do you have difficulty doing errands alone such as visiting a doctor's office or shopping? No Do you currently have any difficulty preparing food and eating?No Do you currently have any difficulty using the toilet?No Do you have any difficulty managing your finances?No Do you have any difficulties with housekeeping of managing your housekeeping?No    Do you have Advanced Directives in place (Living Will, Healthcare Power or Attorney)? Yes    Last eye Exam and location? [facility] ophthalmology, 6 months ago    Do you currently use prescribed or non-prescribed narcotic or opioid pain medications? No  Do you have a history or close family history of breast, ovarian, tubal or peritoneal cancer or a family member with BRCA (breast cancer susceptibility 1 and 2) gene mutations? Yes- Aunt    Nurse/Assistant Credentials/time stamp: Mg 11:05     ----------------------------------------------------------------------------------------------------------------------------------------------------------------------------------------------------------------------  Because this visit was a virtual/telehealth visit, some criteria may be missing or patient reported. Any vitals not documented were not able to be obtained and vitals that have been documented are patient reported.    MEDICARE ANNUAL PREVENTIVE VISIT WITH PROVIDER: (Welcome to Medicare, initial annual wellness or annual wellness exam)  Virtual Visit via Phone Note  I connected with Diane Decker on 10/31/23 by phone and verified that I am speaking with the correct person using two identifiers.  Location patient: home Location provider:work or home office Persons participating in the virtual visit: patient, provider  Concerns and/or follow up today: reports is  doing ok, has seen the dermatologist not too long ago and her endocrinologist. Reports had  to have a skin lesion removed - turn out to all be ok. Had her mammogram in October.    See HM section in Epic for other details of completed HM.    ROS: negative for report of fevers, unintentional weight loss, vision changes, vision loss, hearing loss or change, chest pain, sob, hemoptysis, melena, hematochezia, hematuria, falls, bleeding or bruising, thoughts of suicide or self harm, memory loss  Patient-completed extensive health risk assessment - reviewed and discussed with the patient: See Health Risk Assessment completed with patient prior to the visit either above or in recent phone note. This was reviewed in detailed with the patient today and appropriate recommendations, orders and referrals were placed as needed per Summary below and patient instructions.   Review of Medical History: -PMH, PSH, Family History and current specialty and care providers reviewed and updated and listed below   Patient Care Team: Theophilus Andrews, Tully GRADE, MD as PCP - General (Internal Medicine)   Past Medical History:  Diagnosis Date   Allergy    Arthritis    Barrett's esophagus    Cancer (HCC)    skin cancer   Chicken pox    Depression    Diabetes mellitus without complication (HCC)    Family history of polyps in the colon    GERD (gastroesophageal reflux disease)    Hypertension    Osteopenia    Sleep apnea    borderline- no cpap use     Past Surgical History:  Procedure Laterality Date   ABDOMINAL HYSTERECTOMY     BREAST BIOPSY  2007   COLONOSCOPY     KNEE ARTHROSCOPY Left    ~15 yrs ago    POLYPECTOMY     TONSILLECTOMY  1949   UPPER GASTROINTESTINAL ENDOSCOPY      Social History   Socioeconomic History   Marital status: Widowed    Spouse name: Not on file   Number of children: Not on file   Years of education: Not on file   Highest education level: Bachelor's degree (e.g., BA, AB, BS)  Occupational History   Not on file  Tobacco Use   Smoking status: Former    Smokeless tobacco: Never   Tobacco comments:    long ago-   Vaping Use   Vaping status: Never Used  Substance and Sexual Activity   Alcohol use: Never   Drug use: Never   Sexual activity: Not on file  Other Topics Concern   Not on file  Social History Narrative   Not on file   Social Drivers of Health   Financial Resource Strain: Low Risk  (10/31/2023)   Overall Financial Resource Strain (CARDIA)    Difficulty of Paying Living Expenses: Not hard at all  Food Insecurity: No Food Insecurity (10/31/2023)   Hunger Vital Sign    Worried About Running Out of Food in the Last Year: Never true    Ran Out of Food in the Last Year: Never true  Transportation Needs: No Transportation Needs (10/31/2023)   PRAPARE - Administrator, Civil Service (Medical): No    Lack of Transportation (Non-Medical): No  Physical Activity: Insufficiently Active (10/31/2023)   Exercise Vital Sign    Days of Exercise per Week: 2 days    Minutes of Exercise per Session: 60 min  Stress: No Stress Concern Present (10/31/2023)   [employer] of Occupational Health - Occupational  Stress Questionnaire    Feeling of Stress : Not at all  Social Connections: Moderately Isolated (10/31/2023)   Social Connection and Isolation Panel [NHANES]    Frequency of Communication with Friends and Family: More than three times a week    Frequency of Social Gatherings with Friends and Family: Once a week    Attends Religious Services: Never    Database Administrator or Organizations: Yes    Attends Banker Meetings: 1 to 4 times per year    Marital Status: Widowed  Intimate Partner Violence: Not At Risk (10/31/2023)   Humiliation, Afraid, Rape, and Kick questionnaire    Fear of Current or Ex-Partner: No    Emotionally Abused: No    Physically Abused: No    Sexually Abused: No    Family History  Problem Relation Age of Onset   Breast cancer Mother    Lung cancer Father    Liver cancer Father     Arthritis Sister    Depression Sister    Hyperlipidemia Sister    Hypertension Sister    Alcohol abuse Brother    Arthritis Brother    Depression Brother    Hyperlipidemia Brother    Hypertension Brother    Cancer Daughter    COPD Daughter    Depression Son    Arthritis Maternal Grandmother    Depression Maternal Grandmother    Hyperlipidemia Maternal Grandmother    Hypertension Maternal Grandmother    Stroke Maternal Grandmother    Alcohol abuse Maternal Grandfather    Arthritis Maternal Grandfather    Cancer Maternal Grandfather    Hyperlipidemia Maternal Grandfather    Hypertension Maternal Grandfather    Stroke Maternal Grandfather    Arthritis Paternal Grandmother    Hyperlipidemia Paternal Grandmother    Hypertension Paternal Grandmother    Arthritis Paternal Grandfather    Alcohol abuse Paternal Grandfather    Diabetes Paternal Grandfather    Hyperlipidemia Paternal Grandfather    Heart disease Paternal Grandfather    Hearing loss Paternal Grandfather    Stroke Paternal Grandfather    Colon polyps Neg Hx    Esophageal cancer Neg Hx    Rectal cancer Neg Hx    Stomach cancer Neg Hx     Current Outpatient Medications on File Prior to Visit  Medication Sig Dispense Refill   Accu-Chek Softclix Lancets lancets Use once daily Dx E11.9 100 each 0   allopurinol  (ZYLOPRIM ) 100 MG tablet Take 1 tablet (100 mg total) by mouth daily. 30 tablet 3   aspirin 81 MG chewable tablet Chew by mouth daily.     atenolol  (TENORMIN ) 50 MG tablet TAKE 1 TABLET BY MOUTH EVERY DAY 90 tablet 0   atorvastatin  (LIPITOR) 20 MG tablet TAKE 1 TABLET BY MOUTH EVERY DAY 90 tablet 0   Blood Glucose Monitoring Suppl (ACCU-CHEK GUIDE ME) w/Device KIT Use daily for glucose control . Dx E11.9 1 kit 0   colchicine  0.6 MG tablet Take 0.5 tablets (0.3 mg total) by mouth daily as needed (gout or psuedogout pain). 30 tablet 2   cycloSPORINE (RESTASIS) 0.05 % ophthalmic emulsion 1 drop 2 (two) times daily.      empagliflozin  (JARDIANCE ) 10 MG TABS tablet Take 1 tablet (10 mg total) by mouth daily before breakfast. 90 tablet 3   esomeprazole (NEXIUM) 20 MG capsule Take 20 mg by mouth daily at 6 (six) AM.     glipiZIDE  (GLUCOTROL ) 5 MG tablet Take 0.5 tablets (2.5 mg total) by mouth  daily before breakfast. 90 tablet 3   glucose blood (ACCU-CHEK GUIDE) test strip Test once daily for glucose control.  Dx E11.9 100 each 12   hydrochlorothiazide  (HYDRODIURIL ) 25 MG tablet TAKE 1 TABLET (25 MG TOTAL) BY MOUTH DAILY. 90 tablet 1   HYDROcodone -acetaminophen  (NORCO/VICODIN) 5-325 MG tablet Take 1 tablet by mouth every 6 (six) hours as needed. 15 tablet 0   metFORMIN  (GLUCOPHAGE ) 500 MG tablet Take 1 tablet (500 mg total) by mouth 2 (two) times daily with a meal. 180 tablet 3   Multiple Vitamin (MULTIVITAMIN) capsule Take 1 capsule by mouth daily.     Semaglutide ,0.25 or 0.5MG /DOS, 2 MG/3ML SOPN Inject 0.5 mg into the skin once a week. 9 mL 3   traZODone  (DESYREL ) 150 MG tablet TAKE 1/2 TABLET BY MOUTH EVERY EVENING 45 tablet 1   vitamin E 1000 UNIT capsule Take 1,000 Units by mouth daily.     No current facility-administered medications on file prior to visit.    Allergies  Allergen Reactions   Penicillins Anaphylaxis   Whey Protein [Protein] Shortness Of Breath   Lisinopril Other (See Comments) and Swelling   Losartan Potassium Swelling   Losartan Nausea And Vomiting       Physical Exam Vitals requested from patient and listed below if patient had equipment and was able to obtain at home for this virtual visit: There were no vitals filed for this visit. Estimated body mass index is 42.07 kg/m as calculated from the following:   Height as of 09/26/23: 5' 2 (1.575 m).   Weight as of 09/26/23: 230 lb (104.3 kg).  EKG (optional): deferred due to virtual visit  GENERAL: alert, oriented, no acute distress detected, full vision exam deferred due to pandemic and/or virtual encounter  PSYCH/NEURO:  pleasant and cooperative, no obvious depression or anxiety, speech and thought processing grossly intact, Cognitive function grossly intact  Flowsheet Row Office Visit from 10/31/2023 in [facility] [place]  PHQ-9 Total Score 1           10/31/2023   10:44 AM 05/01/2023   10:24 AM 05/16/2022    9:37 AM 11/06/2021    3:30 PM 05/03/2021    7:53 AM  Depression screen PHQ 2/9  Decreased Interest 1 3 1  0 0  Down, Depressed, Hopeless 0 2 1 0 0  PHQ - 2 Score 1 5 2  0 0  Altered sleeping 0 2 1 1  0  Tired, decreased energy 0 3 1 0 1  Change in appetite 0 2 1 0 0  Feeling bad or failure about yourself  0 2 0 0 0  Trouble concentrating 0 0 0 0 0  Moving slowly or fidgety/restless 0 0 0 0 0  Suicidal thoughts 0 0 0 0 0  PHQ-9 Score 1 14 5 1 1   Difficult doing work/chores Not difficult at all Somewhat difficult Not difficult at all Not difficult at all Not difficult at all       11/06/2021    3:30 PM 05/16/2022    9:37 AM 11/22/2022    2:15 PM 05/01/2023    7:43 AM 10/31/2023   10:47 AM  Fall Risk  Falls in the past year? 0 0 0 0 0  Was there an injury with Fall? 0 0 0  0  Fall Risk Category Calculator 0 0 0  0  Fall Risk Category (Retired) Low Low     (RETIRED) Patient Fall Risk Level Low fall risk Low fall risk  Patient at Risk for Falls Due to  No Fall Risks No Fall Risks  History of fall(s)  Fall risk Follow up  Falls evaluation completed Falls evaluation completed  Falls evaluation completed     SUMMARY AND PLAN:  Encounter for Medicare annual wellness exam   Discussed applicable health maintenance/preventive health measures and advised and referred or ordered per patient preferences: -declined all vaccines except is considering the shingles vaccine -she plans to schedule visit with Dr. Theophilus soon for follow up and do labs then -she says she had her eye exam - sent message to staff to obtain eye report to update chart Health Maintenance  Topic Date  Due   Hepatitis C Screening  Never done   Zoster Vaccines- Shingrix (1 of 2) Never done   Diabetic kidney evaluation - Urine ACR  05/17/2023   OPHTHALMOLOGY EXAM  09/20/2023   DTaP/Tdap/Td (2 - Tdap) 10/30/2024 (Originally 10/02/2006)   Pneumonia Vaccine 68+ Years old (1 of 2 - PCV) 10/30/2028 (Originally 12/14/1951)   INFLUENZA VACCINE  10/30/2028 (Originally 05/23/2023)   COVID-19 Vaccine (5 - 2024-25 season) 10/30/2028 (Originally 06/23/2023)   FOOT EXAM  12/20/2023   HEMOGLOBIN A1C  12/31/2023   Diabetic kidney evaluation - eGFR measurement  09/25/2024   Medicare Annual Wellness (AWV)  10/30/2024   DEXA SCAN  Completed   HPV VACCINES  Aged Out   Colonoscopy  Discontinued      Education and counseling on the following was provided based on the above review of health and a plan/checklist for the patient, along with additional information discussed, was provided for the patient in the patient instructions :   -Advised and counseled on a healthy lifestyle - including the importance of a healthy diet, regular physical activity, social connections and stress management. -she is looking for a new church as misses going to church and recently moved, looked up several of the denomination she was looking for in her area and provided addresses -Reviewed patient's current diet. Advised and counseled on a whole foods based healthy diet. A summary of a healthy diet was provided in the Patient Instructions.  -reviewed patient's current physical activity level and discussed exercise guidelines for adults. Discussed community resources and ideas for safe exercise at home to assist in meeting exercise guideline recommendations in a safe and healthy way.  -Advise yearly dental visits at minimum and regular eye exams  Follow up: see patient instructions     Patient Instructions  I really enjoyed getting to talk with you today! I am available on Tuesdays and Thursdays for virtual visits if you have any  questions or concerns, or if I can be of any further assistance.   CHECKLIST FROM ANNUAL WELLNESS VISIT:  -Follow up (please call to schedule if not scheduled after visit):   -please call to schedule follow up visit with Dr. Theophilus   -yearly for annual wellness visit with primary care office  Here is a list of your preventive care/health maintenance measures and the plan for each if any are due:  PLAN For any measures below that may be due:   Health Maintenance  Topic Date Due   Pneumonia Vaccine 11+ Years old (1 of 2 - PCV) Never done   Hepatitis C Screening  Never done   Zoster Vaccines- Shingrix (1 of 2) Never done   DTaP/Tdap/Td (2 - Tdap) 10/02/2006   Medicare Annual Wellness (AWV)  05/03/2022   Diabetic kidney evaluation - Urine ACR  05/17/2023   INFLUENZA  VACCINE  05/23/2023   COVID-19 Vaccine (5 - 2024-25 season) 06/23/2023   OPHTHALMOLOGY EXAM  09/20/2023   FOOT EXAM  12/20/2023   HEMOGLOBIN A1C  12/31/2023   Diabetic kidney evaluation - eGFR measurement  09/25/2024   DEXA SCAN  Completed   HPV VACCINES  Aged Out   Colonoscopy  Discontinued    -See a dentist at least yearly  -Get your eyes checked and then per your eye specialist's recommendations  -Other issues addressed today:   -I have included below further information regarding a healthy whole foods based diet, physical activity guidelines for adults, stress management and opportunities for social connections. I hope you find this information useful.   -----------------------------------------------------------------------------------------------------------------------------------------------------------------------------------------------------------------------------------------------------------  NUTRITION: -eat real food: lots of colorful vegetables (half the plate) and fruits -5-7 servings of vegetables and fruits per day (fresh or steamed is best), exp. 2 servings of vegetables with lunch and  dinner and 2 servings of fruit per day. Berries and greens such as kale and collards are great choices.  -consume on a regular basis: whole grains (make sure first ingredient on label contains the word whole), fresh fruits, fish, nuts, seeds, healthy oils (such as olive oil, avocado oil, grape seed oil) -may eat small amounts of dairy and lean meat on occasion, but avoid processed meats such as ham, bacon, lunch meat, etc. -drink water -try to avoid fast food and pre-packaged foods, processed meat -most experts advise limiting sodium to < 2300mg  per day, should limit further is any chronic conditions such as high blood pressure, heart disease, diabetes, etc. The American Heart Association advised that < 1500mg  is is ideal -try to avoid foods that contain any ingredients with names you do not recognize  -try to avoid sugar/sweets (except for the natural sugar that occurs in fresh fruit) -try to avoid sweet drinks -try to avoid white rice, white bread, pasta (unless whole grain), white or yellow potatoes  EXERCISE GUIDELINES FOR ADULTS: -if you wish to increase your physical activity, do so gradually and with the approval of your doctor -STOP and seek medical care immediately if you have any chest pain, chest discomfort or trouble breathing when starting or increasing exercise  -move and stretch your body, legs, feet and arms when sitting for long periods -Physical activity guidelines for optimal health in adults: -least 150 minutes per week of aerobic exercise (can talk, but not sing) once approved by your doctor, 20-30 minutes of sustained activity or two 10 minute episodes of sustained activity every day.  -resistance training at least 2 days per week if approved by your doctor -balance exercises 3+ days per week:   Stand somewhere where you have something sturdy to hold onto if you lose balance.    1) lift up on toes, start with 5x per day and work up to 20x   2) stand and lift on leg  straight out to the side so that foot is a few inches of the floor, start with 5x each side and work up to 20x each side   3) stand on one foot, start with 5 seconds each side and work up to 20 seconds on each side  If you need ideas or help with getting more active:  -Silver sneakers https://tools.silversneakers.com  -Walk with a Doc: Http://www.duncan-williams.com/  -try to include resistance (weight lifting/strength building) and balance exercises twice per week: or the following link for ideas: http://castillo-powell.com/  buyducts.dk  STRESS MANAGEMENT: -can try meditating, or just sitting quietly with deep breathing  while intentionally relaxing all parts of your body for 5 minutes daily -if you need further help with stress, anxiety or depression please follow up with your primary doctor or contact the wonderful folks at [facility]: [phone]  SOCIAL CONNECTIONS: -options in [place] if you wish to engage in more social and exercise related activities:  -Silver sneakers https://tools.silversneakers.com  -Walk with a Doc: Http://www.duncan-williams.com/  -Check out the [facility] Active Adults 50+ section on the [place] of [employer] companies (hiking clubs, book clubs, cards and games, chess, exercise classes, aquatic classes and much more) - see the website for details: https://www.[place].gov/departments/parks-recreation/active-adults50  -YouTube has lots of exercise videos for different ages and abilities as well  -[facility] (a variety of indoor and outdoor inperson activities for adults). [phone]. [address]  -Virtual Online Classes (a variety of topics): see seniorplanet.org or call [phone]  -consider volunteering at a school, hospice center, church, senior center or elsewhere           Chiquita JONELLE Cramp, DO

## 2023-10-31 NOTE — Patient Instructions (Addendum)
 I really enjoyed getting to talk with you today! I am available on Tuesdays and Thursdays for virtual visits if you have any questions or concerns, or if I can be of any further assistance.   CHECKLIST FROM ANNUAL WELLNESS VISIT:  -Follow up (please call to schedule if not scheduled after visit):   -please call to schedule follow up visit with Dr. Theophilus   -yearly for annual wellness visit with primary care office  Here is a list of your preventive care/health maintenance measures and the plan for each if any are due:  PLAN For any measures below that may be due:   Health Maintenance  Topic Date Due   Pneumonia Vaccine 43+ Years old (1 of 2 - PCV) Never done   Hepatitis C Screening  Never done   Zoster Vaccines- Shingrix (1 of 2) Never done   DTaP/Tdap/Td (2 - Tdap) 10/02/2006   Medicare Annual Wellness (AWV)  05/03/2022   Diabetic kidney evaluation - Urine ACR  05/17/2023   INFLUENZA VACCINE  05/23/2023   COVID-19 Vaccine (5 - 2024-25 season) 06/23/2023   OPHTHALMOLOGY EXAM  09/20/2023   FOOT EXAM  12/20/2023   HEMOGLOBIN A1C  12/31/2023   Diabetic kidney evaluation - eGFR measurement  09/25/2024   DEXA SCAN  Completed   HPV VACCINES  Aged Out   Colonoscopy  Discontinued    -See a dentist at least yearly  -Get your eyes checked and then per your eye specialist's recommendations  -Other issues addressed today:   -I have included below further information regarding a healthy whole foods based diet, physical activity guidelines for adults, stress management and opportunities for social connections. I hope you find this information useful.   -----------------------------------------------------------------------------------------------------------------------------------------------------------------------------------------------------------------------------------------------------------  NUTRITION: -eat real food: lots of colorful vegetables (half the plate) and  fruits -5-7 servings of vegetables and fruits per day (fresh or steamed is best), exp. 2 servings of vegetables with lunch and dinner and 2 servings of fruit per day. Berries and greens such as kale and collards are great choices.  -consume on a regular basis: whole grains (make sure first ingredient on label contains the word whole), fresh fruits, fish, nuts, seeds, healthy oils (such as olive oil, avocado oil, grape seed oil) -may eat small amounts of dairy and lean meat on occasion, but avoid processed meats such as ham, bacon, lunch meat, etc. -drink water -try to avoid fast food and pre-packaged foods, processed meat -most experts advise limiting sodium to < 2300mg  per day, should limit further is any chronic conditions such as high blood pressure, heart disease, diabetes, etc. The American Heart Association advised that < 1500mg  is is ideal -try to avoid foods that contain any ingredients with names you do not recognize  -try to avoid sugar/sweets (except for the natural sugar that occurs in fresh fruit) -try to avoid sweet drinks -try to avoid white rice, white bread, pasta (unless whole grain), white or yellow potatoes  EXERCISE GUIDELINES FOR ADULTS: -if you wish to increase your physical activity, do so gradually and with the approval of your doctor -STOP and seek medical care immediately if you have any chest pain, chest discomfort or trouble breathing when starting or increasing exercise  -move and stretch your body, legs, feet and arms when sitting for long periods -Physical activity guidelines for optimal health in adults: -least 150 minutes per week of aerobic exercise (can talk, but not sing) once approved by your doctor, 20-30 minutes of sustained activity or two 10 minute  episodes of sustained activity every day.  -resistance training at least 2 days per week if approved by your doctor -balance exercises 3+ days per week:   Stand somewhere where you have something sturdy to  hold onto if you lose balance.    1) lift up on toes, start with 5x per day and work up to 20x   2) stand and lift on leg straight out to the side so that foot is a few inches of the floor, start with 5x each side and work up to 20x each side   3) stand on one foot, start with 5 seconds each side and work up to 20 seconds on each side  If you need ideas or help with getting more active:  -Silver sneakers https://tools.silversneakers.com  -Walk with a Doc: Http://www.duncan-williams.com/  -try to include resistance (weight lifting/strength building) and balance exercises twice per week: or the following link for ideas: http://castillo-powell.com/  buyducts.dk  STRESS MANAGEMENT: -can try meditating, or just sitting quietly with deep breathing while intentionally relaxing all parts of your body for 5 minutes daily -if you need further help with stress, anxiety or depression please follow up with your primary doctor or contact the wonderful folks at [facility]: [phone]  SOCIAL CONNECTIONS: -options in [place] if you wish to engage in more social and exercise related activities:  -Silver sneakers https://tools.silversneakers.com  -Walk with a Doc: Http://www.duncan-williams.com/  -Check out the [facility] Active Adults 50+ section on the [place] of [employer] companies (hiking clubs, book clubs, cards and games, chess, exercise classes, aquatic classes and much more) - see the website for details: https://www.[place].gov/departments/parks-recreation/active-adults50  -YouTube has lots of exercise videos for different ages and abilities as well  -[facility] (a variety of indoor and outdoor inperson activities for adults). [phone]. [address].  -Virtual Online Classes (a variety of topics): see seniorplanet.org or call [phone]  -consider volunteering  at a school, hospice center, church, senior center or elsewhere

## 2023-11-06 ENCOUNTER — Ambulatory Visit: Payer: Medicare PPO | Admitting: Family Medicine

## 2023-11-06 ENCOUNTER — Other Ambulatory Visit: Payer: Self-pay

## 2023-11-06 VITALS — BP 138/82 | HR 69 | Ht 62.0 in | Wt 233.0 lb

## 2023-11-06 DIAGNOSIS — M79672 Pain in left foot: Secondary | ICD-10-CM

## 2023-11-06 DIAGNOSIS — M1A0720 Idiopathic chronic gout, left ankle and foot, without tophus (tophi): Secondary | ICD-10-CM

## 2023-11-06 DIAGNOSIS — M7672 Peroneal tendinitis, left leg: Secondary | ICD-10-CM

## 2023-11-06 LAB — COMPREHENSIVE METABOLIC PANEL
ALT: 46 U/L — ABNORMAL HIGH (ref 0–35)
AST: 55 U/L — ABNORMAL HIGH (ref 0–37)
Albumin: 4.2 g/dL (ref 3.5–5.2)
Alkaline Phosphatase: 79 U/L (ref 39–117)
BUN: 20 mg/dL (ref 6–23)
CO2: 28 meq/L (ref 19–32)
Calcium: 9.8 mg/dL (ref 8.4–10.5)
Chloride: 101 meq/L (ref 96–112)
Creatinine, Ser: 1.16 mg/dL (ref 0.40–1.20)
GFR: 44.72 mL/min — ABNORMAL LOW (ref >60.00)
Glucose, Bld: 189 mg/dL — ABNORMAL HIGH (ref 70–99)
Potassium: 4.3 meq/L (ref 3.5–5.1)
Sodium: 139 meq/L (ref 135–145)
Total Bilirubin: 0.6 mg/dL (ref 0.2–1.2)
Total Protein: 7.3 g/dL (ref 6.0–8.3)

## 2023-11-06 LAB — URIC ACID: Uric Acid, Serum: 5.8 mg/dL (ref 2.4–7.0)

## 2023-11-06 NOTE — Patient Instructions (Addendum)
 Thank you for coming in today.   Please get labs today before you leave   Continue allopurinol  daily. I will adjust the dose if needed based on your labs today.   Please work on the home exercises the athletic trainer went over with you:  View at www.my-exercise-code.com using code: [id number]  Recheck in 1-2 months.   If not better or if worse let me know.

## 2023-11-06 NOTE — Progress Notes (Signed)
 Uric acid level is appropriate continue current dose of allopurinol .

## 2023-11-06 NOTE — Progress Notes (Signed)
HPI :  78 year old female, here for follow-up visit for GERD, elevated liver enzymes.  She was last seen in March 2023.  She has had a mild transaminitis from what I can see and review of the chart over the past 6 months.  Most recently her labs were done yesterday, ALT 46, AST of 55, alk phos 79, T. bili is 0.6.  For the past 6 months her ALT has ranged between 20 and 50, AST between 19 and 57, with normal alk phos and bilirubin.  Dating back earlier, in 2022 she had an AST of 40 and ALT of 32.  Before that in care everywhere her liver enzymes have been normal over years.  She denies any history of known liver disease.  No family history of liver disease.  She does not drink alcohol with any frequency, she will have a very rare drink.  She has been started on allopurinol and colchicine over the past month for her gout, but denies any other new medications or herbal supplementations otherwise.  She has been on Ozempic and has been taking that for the past few years.  She denies any changes to her weight, her diabetes has been well-controlled with an A1c less than 7.  She questions whether the allopurinol or colchicine could be related to her liver enzymes. I do not see any abdominal imaging on file.  She is not in any pain.  No history of jaundice.  She does have a history of GERD and has been taking Nexium dosed at 2 g daily for reflux.  We previously tried to taper her off of this but she could not tolerate and had recurrent symptoms.  She states it works really well for her.  No dysphagia.  She last had an upper endoscopy in May 2021.  No high risk or concerning lesions, Z-line slightly irregular but biopsies negative for Barrett's.  She does have a history of osteopenia, last DEXA scan was back in 2021.  Renal function has been normal.  Recall she has had multiple colonoscopies over the years, all of them have shown polyps in the past.  Recall in 2021 she had 18 polyps removed, her last colonoscopy  was in March 2023, 3 polyps removed that were quite small.  Given her age she has hopes to forego further surveillance colonoscopy.      Most recent endoscopic work-up EGD 03/14/20: Esophagogastric landmarks were identified: the Z-line was found at 34 cm, the gastroesophageal junction was found at 34 cm and the upper extent of the gastric folds was found at 36 cm from the incisors. - A 2 cm hiatal hernia was present. - The Z-line was irregular with small islands of salmon colored mucosa. Biopsies were taken with a cold forceps for histology. - The exam of the esophagus was otherwise normal. - The entire examined stomach was normal. - The duodenal bulb and second portion of the duodenum were normal.     Colonoscopy 03/14/20: The perianal and digital rectal examinations were normal. - Two sessile polyps were found in the cecum. The polyps were 2 to 3 mm in size. These polyps were removed with a cold snare. Resection and retrieval were complete. - A 3 to 4 mm polyp was found in the ascending colon. The polyp was sessile. The polyp was removed with a cold snare. Resection and retrieval were complete. - Ten sessile polyps were found in the transverse colon. The polyps were 3 to 6 mm in size. These polyps  were removed with a cold snare. Resection and retrieval were complete. - Four sessile polyps were found in the descending colon. The polyps were 3 to 5 mm in size. These polyps were removed with a cold snare. Resection and retrieval were complete. - A 4 mm polyp was found in the sigmoid colon. The polyp was sessile. The polyp was removed with a cold snare. Resection and retrieval were complete. - A few small-mouthed diverticula were found in the sigmoid colon. - The exam was otherwise without abnormality.    1. Surgical [P], esophagus, GE junction - GASTROESOPHAGEAL MUCOSA WITH MILD INFLAMMATION. - NO INTESTINAL METAPLASIA, DYSPLASIA OR CARCINOMA. 2. Surgical [P], colon, transverse,  cecal, ascending, descending, sigmoid, polyp (18) - TUBULAR ADENOMA(S). - NO HIGH GRADE DYSPLASIA OR CARCINOMA.   Colonoscopy 12/28/21: - The perianal and digital rectal examinations were normal. - A 3 mm polyp was found in the transverse colon. The polyp was sessile. The polyp was removed with a cold snare. Resection and retrieval were complete. - A 3 mm polyp was found in the descending colon. The polyp was sessile. The polyp was removed with a cold snare. Resection and retrieval were complete. - A 3 mm polyp was found in the sigmoid colon. The polyp was sessile. The polyp was removed with a cold snare. Resection and retrieval were complete. - Internal hemorrhoids were found during retroflexion. - The exam was otherwise without abnormality.   Surgical [P], colon, sigmoid, descending, and transverse, polyp (3) - TUBULAR ADENOMAS (3), NEGATIVE FOR HIGH-GRADE DYSPLASIA.    Past Medical History:  Diagnosis Date   Allergy    Arthritis    Barrett's esophagus    Cancer (HCC)    skin cancer   Chicken pox    Depression    Diabetes mellitus without complication (HCC)    Elevated LFTs    Family history of polyps in the colon    GERD (gastroesophageal reflux disease)    Gout    Hypertension    Osteopenia    Sleep apnea    borderline- no cpap use      Past Surgical History:  Procedure Laterality Date   ABDOMINAL HYSTERECTOMY     BREAST BIOPSY  2007   COLONOSCOPY     KNEE ARTHROSCOPY Left    ~15 yrs ago    POLYPECTOMY     TONSILLECTOMY  1949   UPPER GASTROINTESTINAL ENDOSCOPY     Family History  Problem Relation Age of Onset   Breast cancer Mother    Lung cancer Father    Liver cancer Father    Arthritis Sister    Depression Sister    Hyperlipidemia Sister    Hypertension Sister    Alcohol abuse Brother    Arthritis Brother    Depression Brother    Hyperlipidemia Brother    Hypertension Brother    Cancer Daughter    COPD Daughter    Depression Son    Arthritis  Maternal Grandmother    Depression Maternal Grandmother    Hyperlipidemia Maternal Grandmother    Hypertension Maternal Grandmother    Stroke Maternal Grandmother    Alcohol abuse Maternal Grandfather    Arthritis Maternal Grandfather    Cancer Maternal Grandfather    Hyperlipidemia Maternal Grandfather    Hypertension Maternal Grandfather    Stroke Maternal Grandfather    Arthritis Paternal Grandmother    Hyperlipidemia Paternal Grandmother    Hypertension Paternal Grandmother    Arthritis Paternal Grandfather    Alcohol abuse Paternal  Grandfather    Diabetes Paternal Grandfather    Hyperlipidemia Paternal Grandfather    Heart disease Paternal Grandfather    Hearing loss Paternal Grandfather    Stroke Paternal Grandfather    Colon polyps Neg Hx    Esophageal cancer Neg Hx    Rectal cancer Neg Hx    Stomach cancer Neg Hx    Social History   Tobacco Use   Smoking status: Former   Smokeless tobacco: Never   Tobacco comments:    long ago-   Advertising account planner   Vaping status: Never Used  Substance Use Topics   Alcohol use: Never   Drug use: Never   Current Outpatient Medications  Medication Sig Dispense Refill   Accu-Chek Softclix Lancets lancets Use once daily Dx E11.9 100 each 0   allopurinol (ZYLOPRIM) 100 MG tablet Take 1 tablet (100 mg total) by mouth daily. 30 tablet 3   aspirin 81 MG chewable tablet Chew by mouth daily.     atenolol (TENORMIN) 50 MG tablet TAKE 1 TABLET BY MOUTH EVERY DAY 90 tablet 0   atorvastatin (LIPITOR) 20 MG tablet TAKE 1 TABLET BY MOUTH EVERY DAY 90 tablet 0   Blood Glucose Monitoring Suppl (ACCU-CHEK GUIDE ME) w/Device KIT Use daily for glucose control . Dx E11.9 1 kit 0   colchicine 0.6 MG tablet Take 0.5 tablets (0.3 mg total) by mouth daily as needed (gout or psuedogout pain). 30 tablet 2   cycloSPORINE (RESTASIS) 0.05 % ophthalmic emulsion 1 drop 2 (two) times daily.     esomeprazole (NEXIUM) 20 MG capsule Take 20 mg by mouth daily at 6 (six)  AM.     glipiZIDE (GLUCOTROL) 5 MG tablet Take 0.5 tablets (2.5 mg total) by mouth daily before breakfast. 90 tablet 3   glucose blood (ACCU-CHEK GUIDE) test strip Test once daily for glucose control.  Dx E11.9 100 each 12   hydrochlorothiazide (HYDRODIURIL) 25 MG tablet TAKE 1 TABLET (25 MG TOTAL) BY MOUTH DAILY. 90 tablet 1   HYDROcodone-acetaminophen (NORCO/VICODIN) 5-325 MG tablet Take 1 tablet by mouth every 6 (six) hours as needed. 15 tablet 0   metFORMIN (GLUCOPHAGE) 500 MG tablet Take 1 tablet (500 mg total) by mouth 2 (two) times daily with a meal. 180 tablet 3   Multiple Vitamin (MULTIVITAMIN) capsule Take 1 capsule by mouth daily.     Semaglutide,0.25 or 0.5MG /DOS, 2 MG/3ML SOPN Inject 0.5 mg into the skin once a week. 9 mL 3   traZODone (DESYREL) 150 MG tablet TAKE 1/2 TABLET BY MOUTH EVERY EVENING 45 tablet 1   vitamin E 1000 UNIT capsule Take 1,000 Units by mouth daily.     empagliflozin (JARDIANCE) 10 MG TABS tablet Take 1 tablet (10 mg total) by mouth daily before breakfast. (Patient not taking: Reported on 11/07/2023) 90 tablet 3   No current facility-administered medications for this visit.   Allergies  Allergen Reactions   Penicillins Anaphylaxis   Whey Protein [Protein] Shortness Of Breath   Lisinopril Other (See Comments) and Swelling   Losartan Potassium Swelling   Losartan Nausea And Vomiting     Review of Systems: All systems reviewed and negative except where noted in HPI.   Lab Results  Component Value Date   WBC 7.8 05/01/2023   HGB 12.8 05/01/2023   HCT 39.2 05/01/2023   MCV 92.3 05/01/2023   PLT 232.0 05/01/2023   Lab Results  Component Value Date   ALT 46 (H) 11/06/2023   AST 55 (H)  11/06/2023   ALKPHOS 79 11/06/2023   BILITOT 0.6 11/06/2023    Lab Results  Component Value Date   NA 139 11/06/2023   CL 101 11/06/2023   K 4.3 11/06/2023   CO2 28 11/06/2023   BUN 20 11/06/2023   CREATININE 1.16 11/06/2023   GFR 44.72 (L) 11/06/2023    CALCIUM 9.8 11/06/2023   ALBUMIN 4.2 11/06/2023   GLUCOSE 189 (H) 11/06/2023     Physical Exam: BP 122/72   Pulse 75   Ht 5\' 2"  (1.575 m)   Wt 232 lb (105.2 kg)   SpO2 94%   BMI 42.43 kg/m  Constitutional: Pleasant,well-developed, female in no acute distress. Abdominal: Soft, nondistended, nontender. There are no masses palpable. No hepatomegaly. Extremities: no edema Neurological: Alert and oriented to person place and time. Psychiatric: Normal mood and affect. Behavior is normal.   ASSESSMENT: 78 y.o. female here for assessment of the following  1. Elevated liver enzymes   2. Gastroesophageal reflux disease, unspecified whether esophagitis present   3. Long-term current use of proton pump inhibitor therapy   4. History of colon polyps    Mild transaminitis at least for the past 6 years although also seen back in 2022.  Prior to that her liver enzymes were normal.  I reviewed her medications with her.  While she has started allopurinol a month ago I do not think this is causing her elevated liver enzymes that predate it to use.  Discussed differential diagnosis with her, she is at risk for fatty liver given BMI of 42.  Recommend initial serologic workup to rule out causes for chronic liver disease and a right upper quadrant ultrasound to screen for steatosis and rule out other pathology.  Further recommendations based on the results of the workup.  She understands and agrees.  Reviewed her history of reflux.  She takes Nexium longstanding and that works pretty well to control her symptoms.  No alarm symptoms or dysphagia.  Discussed long-term risks of chronic PPI use.  She has tried stopping and tapering off but that has not worked well and she had recurrent symptoms that bother her.  Long-term recommend the lowest dose needed to control her symptoms.  She does have an osteopenia, understands PPI can increase her fracture risk.  Recommend repeat DEXA to reassess her bone health,  ensure no osteoporosis.  Otherwise reviewed her colonoscopy exams, her last exam had no high risk lesions.  Her preference is to forego further surveillance colonoscopy given her age.  PLAN: - labs today to further evaluate elevated liver enzymes - schedule RUQ [place]  - continue nexium - discussed long term risks / benefits, use lowest dose needed to control symptoms - refer for DEXA scan given osteopenia, overdue - due to age she is forgoing further colonoscopy exams  Harlin Rain, MD [facility]

## 2023-11-06 NOTE — Progress Notes (Signed)
   Joanna Muck, PhD, LAT, ATC acting as a scribe for Garlan Juniper, MD.  Diane Decker is a 78 y.o. female who presents to [facility] today for f/u L foot pain. Pt was last seen by Dr. Alease Hunter on 09/26/23 and labs were obtained. Also prescribed hydrocodone  and cont post-op shoe. Based on lab results, she was advised to start colchicine .   Today, pt reports some confusion about her rx. She only needed to take one hydrocodone . She has been taking the allopurinol  at night and has only taking 1 colchicine . Flare in her Great toe has improved after about a wk. She c/o cont'd pain along the lateral aspect of her L ankle, distal to the lateral malleolus.   Dx testing: 09/26/23 L foot XR & labs 08/22/23 L foot XR   Pertinent review of systems: No fevers or chills  Relevant historical information: Hypertension and diabetes   Exam:  BP 138/82   Pulse 69   Ht 5\' 2"  (1.575 m)   Wt 233 lb (105.7 kg)   SpO2 98%   BMI 42.62 kg/m  General: Well Developed, well nourished, and in no acute distress.   MSK: Left ankle swollen laterally.  Tender palpation just posterior to lateral malleolus.  Normal foot and ankle motion. Pain present with resisted ankle eversion. Stable ligamentous exam. Pulses capillary fill and sensation are intact distally.    Lab and Radiology Results  Diagnostic Limited MSK Ultrasound of: Left lateral ankle Peroneal tendons visualized some hypoechoic fluid surrounding the tendon within the tendon sheath at the lateral malleolus.  No visible tear is present. Superficial to tendon there is hypoechoic fluid tracking within subcutaneous tissue consistent with edema. Impression: Lower extremity edema and peroneal tenosynovitis     Assessment and Plan: 78 y.o. female with improving gout flare left great toe.  Patient is taking allopurinol  daily now and colchicine  only intermittently.  She has not required it much at all.  She does  have a prescription for hydrocodone  that she only had to take 1 pill of.  Plan to recheck uric acid level as well as metabolic panel and adjust allopurinol  dose if needed.  Additionally she has left lateral ankle pain due to prior dominantly had a peroneal tenosynovitis.  Home exercise program taught in clinic today prior to discharge.  If needed we could add formal physical therapy or even do an injection.  Recheck in 1 to 2 months.   PDMP not reviewed this encounter. Orders Placed This Encounter  Procedures   US  LIMITED JOINT SPACE STRUCTURES LOW LEFT(NO LINKED CHARGES)    Reason for Exam (SYMPTOM  OR DIAGNOSIS REQUIRED):   left ankle pain    Preferred imaging location?:   [facility]   Comprehensive metabolic panel    Standing Status:   Future    Expiration Date:   11/05/2024   Uric acid    Standing Status:   Future    Expiration Date:   11/05/2024   No orders of the defined types were placed in this encounter.    Discussed warning signs or symptoms. Please see discharge instructions. Patient expresses understanding.   The above documentation has been reviewed and is accurate and complete Garlan Juniper, M.D.

## 2023-11-07 ENCOUNTER — Other Ambulatory Visit (INDEPENDENT_AMBULATORY_CARE_PROVIDER_SITE_OTHER): Payer: Medicare PPO

## 2023-11-07 ENCOUNTER — Ambulatory Visit (INDEPENDENT_AMBULATORY_CARE_PROVIDER_SITE_OTHER)
Admission: RE | Admit: 2023-11-07 | Discharge: 2023-11-07 | Disposition: A | Discharge status: Home / Self Care | Payer: Medicare PPO | Source: Ambulatory Visit | Attending: Gastroenterology | Admitting: Gastroenterology

## 2023-11-07 ENCOUNTER — Ambulatory Visit: Payer: Medicare PPO | Admitting: Gastroenterology

## 2023-11-07 ENCOUNTER — Encounter: Payer: Self-pay | Admitting: Gastroenterology

## 2023-11-07 VITALS — BP 122/72 | HR 75 | Ht 62.0 in | Wt 232.0 lb

## 2023-11-07 DIAGNOSIS — Z1159 Encounter for screening for other viral diseases: Secondary | ICD-10-CM | POA: Diagnosis not present

## 2023-11-07 DIAGNOSIS — M8580 Other specified disorders of bone density and structure, unspecified site: Secondary | ICD-10-CM

## 2023-11-07 DIAGNOSIS — Z79899 Other long term (current) drug therapy: Secondary | ICD-10-CM

## 2023-11-07 DIAGNOSIS — R748 Abnormal levels of other serum enzymes: Secondary | ICD-10-CM | POA: Diagnosis not present

## 2023-11-07 DIAGNOSIS — Z860100 Personal history of colon polyps, unspecified: Secondary | ICD-10-CM

## 2023-11-07 DIAGNOSIS — K219 Gastro-esophageal reflux disease without esophagitis: Secondary | ICD-10-CM | POA: Diagnosis not present

## 2023-11-07 DIAGNOSIS — R7401 Elevation of levels of liver transaminase levels: Secondary | ICD-10-CM | POA: Diagnosis not present

## 2023-11-07 LAB — IBC + FERRITIN
Ferritin: 27 ng/mL (ref 10.0–291.0)
Iron: 108 ug/dL (ref 42–145)
Saturation Ratios: 23.4 % (ref 20.0–50.0)
TIBC: 460.6 ug/dL — ABNORMAL HIGH (ref 250.0–450.0)
Transferrin: 329 mg/dL (ref 212.0–360.0)

## 2023-11-07 LAB — CK: Total CK: 65 U/L (ref 7–177)

## 2023-11-07 NOTE — Patient Instructions (Signed)
If your blood pressure at your visit was 140/90 or greater, please contact your primary care physician to follow up on this. ______________________________________________________  If you are age 80 or older, your body mass index should be between 23-30. Your Body mass index is 42.43 kg/m. If this is out of the aforementioned range listed, please consider follow up with your Primary Care Provider.  If you are age 26 or younger, your body mass index should be between 19-25. Your Body mass index is 42.43 kg/m. If this is out of the aformentioned range listed, please consider follow up with your Primary Care Provider.  ________________________________________________________  The [place] [facility] providers would like to encourage you to use [facility] to communicate with providers for non-urgent requests or questions.  Due to long hold times on the telephone, sending your provider a message by [facility] may be a faster and more efficient way to get a response.  Please allow 48 business hours for a response.  Please remember that this is for non-urgent requests.  _______________________________________________________  Due to recent changes in healthcare laws, you may see the results of your imaging and laboratory studies on MyChart before your provider has had a chance to review them.  We understand that in some cases there may be results that are confusing or concerning to you. Not all laboratory results come back in the same time frame and the provider may be waiting for multiple results in order to interpret others.  Please give [place] 48 hours in order for your provider to thoroughly review all the results before contacting the office for clarification of your results.    Please go to the lab in the basement of our building to have lab work done as you leave today. Hit "B" for basement when you get on the elevator.  When the doors open the lab is on your left.  We will call you with the results. Thank you.  You  have been scheduled for a bone density test on ______________ at _____________. Please arrive 15 minutes prior to your scheduled appointment to radiology on the basement floor of [facility] location for this test. If you need to cancel or reschedule for any reason, please contact radiology at [phone].  Preparation for test is as follows:  If you are taking calcium, discontinue this 24-48 hours prior to your appointment.  Wear pants with an elastic waistband (or without any metal such as a zipper).  Do not wear an underwire bra.  We do have gowns if you are unable to find appropriate clothing without metal.  Please bring a list of all current medications.  ___________________________________________________________________  Bonita Quin have been scheduled for an abdominal ultrasound at [facility] (1st floor of hospital) on Thursday, 11-14-23 at 10:00am. Please arrive 15 minutes prior to your appointment for registration. Make certain not to have anything to eat or drink 6 hours prior to your appointment. Should you need to reschedule your appointment, please contact radiology at [phone]. This test typically takes about 30 minutes to perform.   Thank you for entrusting me with your care and for choosing [facility], Dr. Ileene Patrick

## 2023-11-10 LAB — ANTI-SMOOTH MUSCLE ANTIBODY, IGG: Actin (Smooth Muscle) Antibody (IGG): <20 U (ref <20)

## 2023-11-10 LAB — ANA: Anti Nuclear Antibody (ANA): NEGATIVE

## 2023-11-10 LAB — HEPATITIS B SURFACE ANTIGEN: Hepatitis B Surface Ag: NONREACTIVE

## 2023-11-10 LAB — HEPATITIS A ANTIBODY, TOTAL: Hepatitis A AB,Total: REACTIVE — AB

## 2023-11-10 LAB — ALPHA-1-ANTITRYPSIN: A-1 Antitrypsin, Ser: 142 mg/dL (ref 83–199)

## 2023-11-10 LAB — IGG: IgG (Immunoglobin G), Serum: 872 mg/dL (ref 600–1540)

## 2023-11-10 LAB — HEPATITIS C ANTIBODY: Hepatitis C Ab: NONREACTIVE

## 2023-11-11 ENCOUNTER — Encounter: Payer: Self-pay | Admitting: Gastroenterology

## 2023-11-14 ENCOUNTER — Ambulatory Visit (HOSPITAL_COMMUNITY)
Admission: RE | Admit: 2023-11-14 | Discharge: 2023-11-14 | Disposition: A | Discharge status: Home / Self Care | Payer: Medicare PPO | Source: Ambulatory Visit | Attending: Gastroenterology | Admitting: Gastroenterology

## 2023-11-14 DIAGNOSIS — R748 Abnormal levels of other serum enzymes: Secondary | ICD-10-CM | POA: Diagnosis not present

## 2023-11-14 DIAGNOSIS — K7689 Other specified diseases of liver: Secondary | ICD-10-CM | POA: Diagnosis not present

## 2023-11-15 ENCOUNTER — Encounter: Payer: Self-pay | Admitting: Gastroenterology

## 2023-12-11 ENCOUNTER — Ambulatory Visit: Payer: Medicare PPO | Admitting: Family Medicine

## 2023-12-19 ENCOUNTER — Other Ambulatory Visit: Payer: Self-pay | Admitting: Internal Medicine

## 2023-12-19 DIAGNOSIS — I10 Essential (primary) hypertension: Secondary | ICD-10-CM

## 2023-12-20 ENCOUNTER — Other Ambulatory Visit: Payer: Self-pay | Admitting: Internal Medicine

## 2023-12-31 ENCOUNTER — Other Ambulatory Visit: Payer: Self-pay | Admitting: Internal Medicine

## 2023-12-31 DIAGNOSIS — D3131 Benign neoplasm of right choroid: Secondary | ICD-10-CM | POA: Diagnosis not present

## 2023-12-31 DIAGNOSIS — Z961 Presence of intraocular lens: Secondary | ICD-10-CM | POA: Diagnosis not present

## 2023-12-31 DIAGNOSIS — E113291 Type 2 diabetes mellitus with mild nonproliferative diabetic retinopathy without macular edema, right eye: Secondary | ICD-10-CM | POA: Diagnosis not present

## 2023-12-31 DIAGNOSIS — H52203 Unspecified astigmatism, bilateral: Secondary | ICD-10-CM | POA: Diagnosis not present

## 2023-12-31 LAB — HM DIABETES EYE EXAM

## 2024-01-01 ENCOUNTER — Ambulatory Visit: Payer: Medicare PPO | Admitting: Internal Medicine

## 2024-01-01 ENCOUNTER — Encounter: Payer: Self-pay | Admitting: Internal Medicine

## 2024-01-01 ENCOUNTER — Telehealth: Payer: Self-pay

## 2024-01-01 VITALS — BP 122/80 | HR 70 | Ht 62.0 in | Wt 232.0 lb

## 2024-01-01 DIAGNOSIS — E1122 Type 2 diabetes mellitus with diabetic chronic kidney disease: Secondary | ICD-10-CM | POA: Diagnosis not present

## 2024-01-01 DIAGNOSIS — Z7985 Long-term (current) use of injectable non-insulin antidiabetic drugs: Secondary | ICD-10-CM

## 2024-01-01 DIAGNOSIS — E113299 Type 2 diabetes mellitus with mild nonproliferative diabetic retinopathy without macular edema, unspecified eye: Secondary | ICD-10-CM | POA: Insufficient documentation

## 2024-01-01 DIAGNOSIS — E1165 Type 2 diabetes mellitus with hyperglycemia: Secondary | ICD-10-CM | POA: Diagnosis not present

## 2024-01-01 DIAGNOSIS — N1831 Chronic kidney disease, stage 3a: Secondary | ICD-10-CM | POA: Diagnosis not present

## 2024-01-01 DIAGNOSIS — Z7984 Long term (current) use of oral hypoglycemic drugs: Secondary | ICD-10-CM

## 2024-01-01 LAB — POCT GLUCOSE (DEVICE FOR HOME USE): POC Glucose: 178 mg/dL — AB (ref 70–99)

## 2024-01-01 LAB — POCT GLYCOSYLATED HEMOGLOBIN (HGB A1C): Hemoglobin A1C: 7.6 % — AB (ref 4.0–5.6)

## 2024-01-01 MED ORDER — METFORMIN HCL 500 MG PO TABS: 500.0000 mg | ORAL_TABLET | Freq: Two times a day (BID) | ORAL | 3 refills | Status: DC

## 2024-01-01 MED ORDER — TIRZEPATIDE 5 MG/0.5ML ~~LOC~~ SOAJ: 5.0000 mg | SUBCUTANEOUS | 11 refills | Status: DC

## 2024-01-01 MED ORDER — GLIPIZIDE 5 MG PO TABS: 5.0000 mg | ORAL_TABLET | Freq: Every day | ORAL | 3 refills | Status: DC

## 2024-01-01 NOTE — Telephone Encounter (Signed)
 Medication Samples have been provided to the patient.  Drug name: Greggory Keen       Strength: 2.5mg         Qty: 1 box   LOT: [id number]  Exp.Date: 05/14/25  Dosing instructions: Inject 2.5mg  once weekly   The patient has been instructed regarding the correct time, dose, and frequency of taking this medication, including desired effects and most common side effects.   Mort Smelser L Damita Eppard 2:32 PM 01/01/2024

## 2024-01-01 NOTE — Patient Instructions (Addendum)
-   Switch Ozempic to mounjaro 2.5 mg weekly for 4 weeks, than increase to 5 mg weekly  - Continue  Glipizide 5 mg, 1 tablet before Breakfast  - Continue Metformin 500 mg ,1 tablets with Breakfast and 1 tablet with supper     HOW TO TREAT LOW BLOOD SUGARS (Blood sugar LESS THAN 70 MG/DL) Please follow the RULE OF 15 for the treatment of hypoglycemia treatment (when your (blood sugars are less than 70 mg/dL)   STEP 1: Take 15 grams of carbohydrates when your blood sugar is low, which includes:  3-4 GLUCOSE TABS  OR 3-4 OZ OF JUICE OR REGULAR SODA OR ONE TUBE OF GLUCOSE GEL    STEP 2: RECHECK blood sugar in 15 MINUTES STEP 3: If your blood sugar is still low at the 15 minute recheck --> then, go back to STEP 1 and treat AGAIN with another 15 grams of carbohydrates.

## 2024-01-01 NOTE — Progress Notes (Signed)
 Name: Diane Decker  Age/ Sex: 78 y.o., female   MRN/ DOB: [id number], [birth year]     PCP: Philip Aspen, Limmie Patricia, MD   Reason for Endocrinology Evaluation: Type 2 Diabetes Mellitus  Initial Endocrine Consultative Visit: 11/10/2020    PATIENT IDENTIFIER: [name] is a 78 y.o. female with a past medical history of T2DM, barrett's esophagus. The patient has followed with Endocrinology clinic since 11/10/2020 for consultative assistance with management of her diabetes.  DIABETIC HISTORY:  [name] was diagnosed with DM at age 36, ozempic caused GI side effects. . Her hemoglobin A1c has ranged from 6.6%  in 2021, peaking at 9.7 %in 2021.    On her initial visit to our clinic her A1c was 6.6 % she was on Glipizide XL, Metformin and wanted to give ozempic another try. We swiched XL glipizide to regular release   Started Jardiance 06/2023 but was cost prohibitive    SUBJECTIVE:   During the last visit (07/03/2023): A1c 7.0%     Today (01/01/2024): [name]  is here for a follow up on diabetes management.   She has been checking glucose occasionally.   She was evaluated by GI for elevated LFT's  Denies nausea or vomiting  She endorses diarrhea with eating an orange but otherwise no changes in bowel movements.   She had a bout of gout  She was noted with mild retinopathy , denies visual changes   HOME DIABETES REGIMEN:  Glipizide 5 mg,1 tab every morning  Metformin 500 mg , 1 tablets BID Ozempic 0.5 mg once weekly ( Tuesday)    Statin: yes  ACE-I/ARB: Intolerant to lisinopril and losartan    CONTINUOUS GLUCOSE MONITORING RECORD INTERPRETATION : n/a    DIABETIC COMPLICATIONS: Microvascular complications:  CKD III, mild retinopathy Denies: neuropathy  Last Eye Exam: Completed 12/31/2023  Macrovascular complications:   Denies: CAD, CVA, PVD   HISTORY:  Past Medical History:  Past Medical History:  Diagnosis Date    Allergy    Arthritis    Barrett's esophagus    Cancer (HCC)    skin cancer   Chicken pox    Depression    Diabetes mellitus without complication (HCC)    Elevated LFTs    Family history of polyps in the colon    GERD (gastroesophageal reflux disease)    Gout    Hypertension    Osteopenia    Sleep apnea    borderline- no cpap use    Past Surgical History:  Past Surgical History:  Procedure Laterality Date   ABDOMINAL HYSTERECTOMY     BREAST BIOPSY  2007   COLONOSCOPY     KNEE ARTHROSCOPY Left    ~15 yrs ago    POLYPECTOMY     TONSILLECTOMY  1949   UPPER GASTROINTESTINAL ENDOSCOPY     Social History:  reports that she has quit smoking. She has never used smokeless tobacco. She reports that she does not drink alcohol and does not use drugs. Family History:  Family History  Problem Relation Age of Onset   Breast cancer Mother    Lung cancer Father    Liver cancer Father    Arthritis Sister    Depression Sister    Hyperlipidemia Sister    Hypertension Sister    Alcohol abuse Brother    Arthritis Brother    Depression Brother    Hyperlipidemia Brother    Hypertension Brother    Cancer Daughter    COPD Daughter  Depression Son    Arthritis Maternal Grandmother    Depression Maternal Grandmother    Hyperlipidemia Maternal Grandmother    Hypertension Maternal Grandmother    Stroke Maternal Grandmother    Alcohol abuse Maternal Grandfather    Arthritis Maternal Grandfather    Cancer Maternal Grandfather    Hyperlipidemia Maternal Grandfather    Hypertension Maternal Grandfather    Stroke Maternal Grandfather    Arthritis Paternal Grandmother    Hyperlipidemia Paternal Grandmother    Hypertension Paternal Grandmother    Arthritis Paternal Grandfather    Alcohol abuse Paternal Grandfather    Diabetes Paternal Grandfather    Hyperlipidemia Paternal Grandfather    Heart disease Paternal Grandfather    Hearing loss Paternal Grandfather    Stroke Paternal  Grandfather    Colon polyps Neg Hx    Esophageal cancer Neg Hx    Rectal cancer Neg Hx    Stomach cancer Neg Hx      HOME MEDICATIONS: Allergies as of 01/01/2024       Reactions   Penicillins Anaphylaxis   Whey Protein [protein] Shortness Of Breath   Lisinopril Other (See Comments), Swelling   Losartan Potassium Swelling   Losartan Nausea And Vomiting        Medication List        Accurate as of January 01, 2024  2:01 PM. If you have any questions, ask your nurse or doctor.          STOP taking these medications    empagliflozin 10 MG Tabs tablet Commonly known as: Jardiance Stopped by: Johnney Ou Babara [place]       TAKE these medications    Accu-Chek Guide Me w/Device Kit Use daily for glucose control . Dx E11.9   Accu-Chek Guide test strip Generic drug: glucose blood Test once daily for glucose control.  Dx E11.9   Accu-Chek Softclix Lancets lancets Use once daily Dx E11.9   allopurinol 100 MG tablet Commonly known as: Zyloprim Take 1 tablet (100 mg total) by mouth daily.   aspirin 81 MG chewable tablet Chew by mouth daily.   atenolol 50 MG tablet Commonly known as: TENORMIN TAKE 1 TABLET BY MOUTH EVERY DAY   atorvastatin 20 MG tablet Commonly known as: LIPITOR TAKE 1 TABLET BY MOUTH EVERY DAY   colchicine 0.6 MG tablet Take 0.5 tablets (0.3 mg total) by mouth daily as needed (gout or psuedogout pain).   cycloSPORINE 0.05 % ophthalmic emulsion Commonly known as: RESTASIS 1 drop 2 (two) times daily.   esomeprazole 20 MG capsule Commonly known as: NEXIUM Take 20 mg by mouth daily at 6 (six) AM.   glipiZIDE 5 MG tablet Commonly known as: GLUCOTROL Take 0.5 tablets (2.5 mg total) by mouth daily before breakfast.   hydrochlorothiazide 25 MG tablet Commonly known as: HYDRODIURIL TAKE 1 TABLET (25 MG TOTAL) BY MOUTH DAILY.   HYDROcodone-acetaminophen 5-325 MG tablet Commonly known as: NORCO/VICODIN Take 1 tablet by mouth every 6 (six) hours  as needed.   metFORMIN 500 MG tablet Commonly known as: GLUCOPHAGE Take 1 tablet (500 mg total) by mouth 2 (two) times daily with a meal.   multivitamin capsule Take 1 capsule by mouth daily.   Semaglutide(0.25 or 0.5MG /DOS) 2 MG/3ML Sopn Inject 0.5 mg into the skin once a week.   traZODone 150 MG tablet Commonly known as: DESYREL TAKE 1/2 TABLET BY MOUTH EVERY EVENING   vitamin E 1000 UNIT capsule Take 1,000 Units by mouth daily.  OBJECTIVE:   Vital Signs: BP 122/80 (BP Location: Left Arm, Patient Position: Sitting, Cuff Size: Small)   Pulse 70   Ht 5\' 2"  (1.575 m)   Wt 232 lb (105.2 kg)   SpO2 96%   BMI 42.43 kg/m   Wt Readings from Last 3 Encounters:  01/01/24 232 lb (105.2 kg)  11/07/23 232 lb (105.2 kg)  11/06/23 233 lb (105.7 kg)     Exam: General: Pt appears well and is in NAD  Lungs: Clear with good BS bilat   Heart: RRR   Extremities: Trace  pretibial edema.   Neuro: MS is good with appropriate affect, pt is alert and Ox3   DM foot exam: 01/01/2024   The skin of the feet is intact without sores or ulcerations. The pedal pulses are 2+ on right and 2+ on left. The sensation is intact to a screening 5.07, 10 gram monofilament bilaterally     DATA REVIEWED:  Lab Results  Component Value Date   HGBA1C 7.6 (A) 01/01/2024   HGBA1C 7.0 (A) 07/03/2023   HGBA1C 7.2 (A) 12/19/2022     Latest Reference Range & Units 11/06/23 11:05  Sodium 135 - 145 mEq/L 139  Potassium 3.5 - 5.1 mEq/L 4.3  Chloride 96 - 112 mEq/L 101  CO2 19 - 32 mEq/L 28  Glucose 70 - 99 mg/dL 161 (H)  BUN 6 - 23 mg/dL 20  Creatinine 0.96 - 0.45 mg/dL 4.09  Calcium 8.4 - 81.1 mg/dL 9.8  Alkaline Phosphatase 39 - 117 U/L 79  Albumin 3.5 - 5.2 g/dL 4.2  AST 0 - 37 U/L 55 (H)  ALT 0 - 35 U/L 46 (H)  Total Protein 6.0 - 8.3 g/dL 7.3  Total Bilirubin 0.2 - 1.2 mg/dL 0.6  GFR >91.47 mL/min 44.72 (L)  (H): Data is abnormally high (L): Data is abnormally low   Latest  Reference Range & Units 07/03/23 14:40  VITD 30.00 - 100.00 ng/mL 42.33     Latest Reference Range & Units 05/01/23 11:07  Sodium 135 - 145 mEq/L 142  Potassium 3.5 - 5.1 mEq/L 4.3  Chloride 96 - 112 mEq/L 102  CO2 19 - 32 mEq/L 28  Glucose 70 - 99 mg/dL 95  BUN 6 - 23 mg/dL 15  Creatinine 8.29 - 5.62 mg/dL 1.30  Calcium 8.4 - 86.5 mg/dL 78.4  Alkaline Phosphatase 39 - 117 U/L 67  Albumin 3.5 - 5.2 g/dL 4.3  AST 0 - 37 U/L 57 (H)  ALT 0 - 35 U/L 50 (H)  Total Protein 6.0 - 8.3 g/dL 7.3  Total Bilirubin 0.2 - 1.2 mg/dL 0.6  GFR >69.62 mL/min 46.81 (L)   In office BG 178 mg/dL    ASSESSMENT / PLAN / RECOMMENDATIONS:   1) Type 2 Diabetes Mellitus, Sub-optimally controlled, With CKD III and retinopathy complications - Most recent A1c of 7.6  %. Goal A1c < 7.0 %.    -A1c has trended up, patient admits to dietary indiscretions and decreased physical activity -No glucose data today -Per patient, cardiology recommended SGLT2 inhibitors but this was cost prohibitive , she will be provided with patient assistance forms -Intolerant to higher dose of Ozempic, and I have recommended switching to [facility] as there is no clinical improvement on Ozempic.  She was provided with number for Mounjaro 2.5 mg sample pens -Due to a GFR < 45, will decrease metformin by 50% as below   MEDICATIONS: Continue glipizide 5 mg daily  Continue  metformin 500 mg,  1 tablets twice daily Switch  Ozempic  0.5 mg weekly to mounjaro 2.5 mg weekly for 4 weeks than increase to 5 mg weekly Start Jardiance 10 mg daily( If approved through pt assistance)  EDUCATION / INSTRUCTIONS: BG monitoring instructions: Patient is instructed to check her blood sugars 1 times a day, fsting Call [facility] if: BG persistently < 70  I reviewed the Rule of 15 for the treatment of hypoglycemia in detail with the patient. Literature supplied.   2) Diabetic complications:  Eye: Does not have known diabetic  retinopathy.  Neuro/ Feet: Does not have known diabetic peripheral neuropathy .  Renal: Patient does not have known baseline CKD. She   is intolerant to  ACEI/ARB     F/U in 4 months   Signed electronically by: Lyndle Herrlich, MD  [facility]  [facility] [address] [place] [place], [place] [address] Phone: [phone] FAX: [phone]   CC: Philip Aspen, Limmie Patricia, MD [address] [place] [address] Phone: [phone]  Fax: [phone]  Return to Endocrinology clinic as below: No future appointments.

## 2024-01-05 ENCOUNTER — Other Ambulatory Visit: Payer: Self-pay | Admitting: Family Medicine

## 2024-01-06 NOTE — Telephone Encounter (Signed)
 Last OV 09/26/23 Next OV 10/28/23 - canceled                12/11/23 - canceled  Last refill 09/27/23 Qty # 30/3

## 2024-01-20 ENCOUNTER — Other Ambulatory Visit (HOSPITAL_COMMUNITY): Payer: Self-pay

## 2024-01-20 ENCOUNTER — Telehealth: Payer: Self-pay

## 2024-01-20 NOTE — Telephone Encounter (Signed)
 Pharmacy Patient Advocate Encounter   Received notification from CoverMyMeds that prior authorization for [facility] is required/requested.   Insurance verification completed.   The patient is insured through [employer] .   Per test claim: PA required; PA started via CoverMyMeds. KEY [id number] . Waiting for clinical questions to populate.   PA CANCELLED: Authorization already on file  Copay for 30 day supply is $40

## 2024-01-28 NOTE — Progress Notes (Unsigned)
 Rubin Payor, PhD, LAT, ATC acting as a scribe for [name], MD.  Michaelle Bottomley Goette is a 78 y.o. female who presents to [facility] at [facility] today for foot, hand, and rib pain. Pt was previously seen by Dr. Denyse Amass for a gout flare in her L foot on 11/05/22.  Today, pt c/o foot, hand, and rib pain after suffering a fall on Sunday morning. Pt was pulling the laundry basket behind her, it caught on a rug, tripping her and she fell forward. Pt locates pain to L knee, toes of the R foot, and L-sided rib cage, and 4th-5th MC on L hand. Swelling and bruising present.   Treatments tried: hydrocodone, IBU  Dx testing: 11/07/23 DEXA scan  Pertinent review of systems: No fevers or chills  Relevant historical information: Diabetes   Exam:  BP 118/74   Pulse 72   Ht 5\' 2"  (1.575 m)   Wt 235 lb (106.6 kg)   SpO2 98%   BMI 42.98 kg/m  General: Well Developed, well nourished, and in no acute distress.   MSK: Left ribs tender to palpation left chest wall.    Left hand some swelling visible on the ulnar aspect of the hand.  No angular deformities.  Mildly tender to palpation.  Intact strength and grip. Left knee normal motion.   Lab and Radiology Results  X-ray images left sided ribs, left hand, right foot and left knee obtained today personally and independently interpreted.  Left ribs: Tiny pleural effusion is present.  No pneumothorax.  No visibly displaced rib fractures.  Left hand: No acute fractures are visible.  Soft tissue swelling is present.  Left knee: DJD and chondrocalcinosis is visible.  No acute fractures are present.  Right foot: No acute fractures are present.  Await formal radiology review    Assessment and Plan: 78 y.o. female with fall with multiple contusions.  Patient does have a fair amount of left-sided rib pain.  She could have a nondisplaced rib fracture that I just do not see on the x-ray today.  Radiology overread is  still pending.  Plan for rib binder and deep inspiration every so often.  She does have some oxycodone that she can take.  We talked about safe dosing of ibuprofen and acetaminophen and oxycodone.  Additionally she has bruises and sore areas multiple other locations in her body.  Bigger issue is fall prevention.  This was a fall.  We talked about vestibular and balance PT.  Plan to refer to [facility] PT with anticipated starting in a few weeks.  Recheck in about 2 weeks. PDMP not reviewed this encounter. Orders Placed This Encounter  Procedures   DG Knee AP/LAT W/[place] Left    Standing Status:   Future    Number of Occurrences:   1    Expiration Date:   02/28/2024    Reason for Exam (SYMPTOM  OR DIAGNOSIS REQUIRED):   left knee pain    Preferred imaging location?:   [facility]   DG Foot Complete Right    Standing Status:   Future    Number of Occurrences:   1    Expiration Date:   01/28/2025    Reason for Exam (SYMPTOM  OR DIAGNOSIS REQUIRED):   right foot pain    Preferred imaging location?:   [facility]   DG Hand Complete Left    Standing Status:   Future    Number of Occurrences:  1    Expiration Date:   02/28/2024    Reason for Exam (SYMPTOM  OR DIAGNOSIS REQUIRED):   left hand pain    Preferred imaging location?:   [facility]   DG Ribs Unilateral W/Chest Left    Standing Status:   Future    Number of Occurrences:   1    Expiration Date:   02/28/2024    Reason for Exam (SYMPTOM  OR DIAGNOSIS REQUIRED):   left sided rib pain    Preferred imaging location?:    [facility]   Ambulatory referral to Physical Therapy    Referral Priority:   Routine    Referral Type:   Physical Medicine    Referral Reason:   Specialty Services Required    Requested Specialty:   Physical Therapy    Number of Visits Requested:   1   No orders of the defined types were placed in this encounter.    Discussed warning signs or symptoms. Please see discharge  instructions. Patient expresses understanding.   The above documentation has been reviewed and is accurate and complete [name], M.D.

## 2024-01-29 ENCOUNTER — Ambulatory Visit: Admitting: Family Medicine

## 2024-01-29 ENCOUNTER — Ambulatory Visit (INDEPENDENT_AMBULATORY_CARE_PROVIDER_SITE_OTHER)

## 2024-01-29 VITALS — BP 118/74 | HR 72 | Ht 62.0 in | Wt 235.0 lb

## 2024-01-29 DIAGNOSIS — M79671 Pain in right foot: Secondary | ICD-10-CM

## 2024-01-29 DIAGNOSIS — M79642 Pain in left hand: Secondary | ICD-10-CM

## 2024-01-29 DIAGNOSIS — M25462 Effusion, left knee: Secondary | ICD-10-CM | POA: Diagnosis not present

## 2024-01-29 DIAGNOSIS — R0781 Pleurodynia: Secondary | ICD-10-CM

## 2024-01-29 DIAGNOSIS — Z043 Encounter for examination and observation following other accident: Secondary | ICD-10-CM | POA: Diagnosis not present

## 2024-01-29 DIAGNOSIS — M25562 Pain in left knee: Secondary | ICD-10-CM

## 2024-01-29 DIAGNOSIS — M19071 Primary osteoarthritis, right ankle and foot: Secondary | ICD-10-CM | POA: Diagnosis not present

## 2024-01-29 DIAGNOSIS — M1812 Unilateral primary osteoarthritis of first carpometacarpal joint, left hand: Secondary | ICD-10-CM | POA: Diagnosis not present

## 2024-01-29 DIAGNOSIS — M1712 Unilateral primary osteoarthritis, left knee: Secondary | ICD-10-CM | POA: Diagnosis not present

## 2024-01-29 DIAGNOSIS — M7731 Calcaneal spur, right foot: Secondary | ICD-10-CM | POA: Diagnosis not present

## 2024-01-29 DIAGNOSIS — M109 Gout, unspecified: Secondary | ICD-10-CM | POA: Diagnosis not present

## 2024-01-29 NOTE — Patient Instructions (Addendum)
 Thank you for coming in today.   Referral placed to Neuro Rehab at [facility] A referral for physical therapy has been submitted. A representative from the physical therapy office will contact you to coordinate scheduling after confirming your benefits with your insurance provider. If you do not hear from the physical therapy office within the next 1-2 weeks, please let us know.  OK to take the following: IBU 600 mg every 8 hours Tylenol 325 mg every 8 hours  Oxycodone as directed  Look for the following: Rib Binder Bed Assist Ladder  See you back in 2 weeks

## 2024-01-30 ENCOUNTER — Encounter: Payer: Self-pay | Admitting: Family Medicine

## 2024-01-30 NOTE — Progress Notes (Signed)
 Left hand xray shows severe arthritis at the base of the thumb. Medium arthritis elsewhere in the hand.  No fractures seen.

## 2024-01-30 NOTE — Progress Notes (Signed)
Left knee x-ray shows medium arthritis in the knee.

## 2024-01-30 NOTE — Progress Notes (Signed)
 Left rib and chest x-ray shows a tiny pleural effusion but no fractures are visible to the radiologist either.

## 2024-01-30 NOTE — Progress Notes (Signed)
 Right foot x-ray shows some arthritis and possibly evidence of gout.  We checked her uric acid about 2 months ago and it was in acceptable range so if you did have gout it is now looks to be better controlled.  No broken bones are present.

## 2024-02-01 ENCOUNTER — Other Ambulatory Visit: Payer: Self-pay | Admitting: Internal Medicine

## 2024-02-12 ENCOUNTER — Encounter: Payer: Self-pay | Admitting: Family Medicine

## 2024-02-12 ENCOUNTER — Ambulatory Visit: Admitting: Family Medicine

## 2024-02-12 VITALS — BP 132/82 | HR 71 | Ht 62.0 in | Wt 239.0 lb

## 2024-02-12 DIAGNOSIS — M79672 Pain in left foot: Secondary | ICD-10-CM

## 2024-02-12 DIAGNOSIS — M25562 Pain in left knee: Secondary | ICD-10-CM

## 2024-02-12 DIAGNOSIS — M79642 Pain in left hand: Secondary | ICD-10-CM | POA: Diagnosis not present

## 2024-02-12 DIAGNOSIS — M79671 Pain in right foot: Secondary | ICD-10-CM

## 2024-02-12 DIAGNOSIS — R0781 Pleurodynia: Secondary | ICD-10-CM

## 2024-02-12 NOTE — Patient Instructions (Addendum)
 Thank you for coming in today.   Return as needed.   Plan for PT.

## 2024-02-12 NOTE — Progress Notes (Signed)
   I, Miquel Amen, CMA acting as a scribe for Garlan Juniper, MD.  Diane Decker is a 78 y.o. female who presents to [facility] at [facility] today for 2-wk f/u for multiple pains after a fall. Pt was last seen by Dr. Alease Hunter on 01/29/24 and was advised to use a rib binder and do deep inspiration. They discussed safe pain medication use and was referred to neuro-rehab, 1st visit scheduled for 4/30  Today, pt reports cutting pain meds back to 3 IBU BID. Has been sleeping better, only taking 1/2 tablet of sleeping med. Continues to have some discomfort left side ribs with left side lying. Able to twist without pain. Continues to have aching pain between to 4th and 5th fingers into the hand. Bruising on the leg is healing but still present. Still favoring the right knee. No longer ambulating with a cane. The right foot is feeling better.   Overall she is feeling pretty good.  Pertinent review of systems: No fevers or chills  Relevant historical information: Hypertension sleep apnea diabetes.   Exam:  BP 132/82   Pulse 71   Ht 5\' 2"  (1.575 m)   Wt 239 lb (108.4 kg)   SpO2 95%   BMI 43.71 kg/m  General: Well Developed, well nourished, and in no acute distress.   MSK: Left hand normal.  Normal motion. Left knee normal.  Normal motion nontender to palpation.  Left chest wall normal trunk motion normal rib expansion with breathing.    Assessment and Plan: 78 y.o. female with improved multiple orthopedic injuries following fall occurring a few weeks ago.  Overall doing pretty well.  Plan for watchful waiting from an orthopedic standpoint.  She is engaged with physical therapy for gait training and fall prevention which I think will be helpful.  Initial assessment is scheduled for April 30.   PDMP not reviewed this encounter. No orders of the defined types were placed in this encounter.  No orders of the defined types were placed in this  encounter.    Discussed warning signs or symptoms. Please see discharge instructions. Patient expresses understanding.   The above documentation has been reviewed and is accurate and complete Garlan Juniper, M.D.

## 2024-02-19 ENCOUNTER — Ambulatory Visit: Attending: Family Medicine

## 2024-03-02 NOTE — Therapy (Incomplete)
 OUTPATIENT PHYSICAL THERAPY NEURO EVALUATION   Patient Name: Diane Decker MRN: [id number] DOB:[birth year], 78 y.o., female Today's Date: 03/02/2024   PCP: Zilphia Hilt, Charyl Coppersmith, MD  REFERRING PROVIDER: Syliva Even, MD  END OF SESSION:   Past Medical History:  Diagnosis Date   Allergy    Arthritis    Barrett's esophagus    Cancer (HCC)    skin cancer   Chicken pox    Depression    Diabetes mellitus without complication (HCC)    Elevated LFTs    Family history of polyps in the colon    GERD (gastroesophageal reflux disease)    Gout    Hypertension    Osteopenia    Sleep apnea    borderline- no cpap use    Past Surgical History:  Procedure Laterality Date   ABDOMINAL HYSTERECTOMY     BREAST BIOPSY  2007   COLONOSCOPY     KNEE ARTHROSCOPY Left    ~15 yrs ago    POLYPECTOMY     TONSILLECTOMY  1949   UPPER GASTROINTESTINAL ENDOSCOPY     Patient Active Problem List   Diagnosis Date Noted   Type 2 diabetes mellitus with stage 3a chronic kidney disease, without long-term current use of insulin (HCC) 01/01/2024   Type 2 diabetes mellitus with mild nonproliferative retinopathy without macular edema, without long-term current use of insulin (HCC) 01/01/2024   Chronic idiopathic gout involving toe of left foot without tophus 11/06/2023   Elevated LFTs 07/03/2023   Synovial osteochondromatosis of shoulder 01/03/2023   Chronic right shoulder pain 01/03/2023   Type 2 diabetes mellitus without complication, without long-term current use of insulin (HCC) 12/18/2021   Adenomatous colon polyp 10/12/2019   Age-related osteoporosis without current pathological fracture 10/12/2019   Edema 10/12/2019   Barrett esophagus 10/12/2019   Insomnia 10/12/2019   Postmenopausal 10/12/2019   HTN (hypertension) 09/29/2019   DM (diabetes mellitus), type 2, uncontrolled 09/29/2019   Depression, recurrent (HCC) 09/29/2019   Hyperlipidemia associated with type 2 diabetes  mellitus (HCC) 09/29/2019   Morbid obesity (HCC) 09/29/2019   B12 deficiency 03/12/2017   Vitamin D  deficiency 03/12/2017   OSA (obstructive sleep apnea) 05/03/2016   BCC (basal cell carcinoma of skin) 06/16/2014   Presbyopia 05/20/2014   Senile nuclear sclerosis 05/20/2014   Transaminitis 01/14/2014   Type 2 diabetes mellitus without complication (HCC) 04/22/2003    ONSET DATE: 01/29/24  REFERRING DIAG: W09.811 (ICD-10-CM) - Acute pain of left knee M79.671 (ICD-10-CM) - Right foot pain M79.642 (ICD-10-CM) - Left hand pain R07.81 (ICD-10-CM) - Rib pain on left side  THERAPY DIAG:  No diagnosis found.  Rationale for Evaluation and Treatment: Rehabilitation  SUBJECTIVE:  SUBJECTIVE STATEMENT: *** Pt accompanied by: {accompnied:27141}  PERTINENT HISTORY: Skin CA, depression, DM, gout, HTN, L knee scope  PAIN:  Are you having pain? {OPRCPAIN:27236}  PRECAUTIONS: {Therapy precautions:24002}  RED FLAGS: {PT Red Flags:29287}   WEIGHT BEARING RESTRICTIONS: {Yes ***/No:24003}  FALLS: Has patient fallen in last 6 months? {fallsyesno:27318}  LIVING ENVIRONMENT: Lives with: {OPRC lives with:25569::"lives with their family"} Lives in: {Lives in:25570} Stairs: {opstairs:27293} Has following equipment at home: {Assistive devices:23999}  PLOF: {PLOF:24004}  PATIENT GOALS: ***  OBJECTIVE:  Note: Objective measures were completed at Evaluation unless otherwise noted.  DIAGNOSTIC FINDINGS: 01/29/24 L hand xray: Severe thumb carpometacarpal osteoarthritis. 2-5 DIP and PIP OA  01/29/24 Ribs xray: Minimal blunting of the left costophrenic angle may represent a small pleural effusion versus chronic scarring.   COGNITION: Overall cognitive status:  {cognition:24006}   SENSATION: {sensation:27233}  EDEMA:  {edema:24020}  POSTURE: {posture:25561}  LOWER EXTREMITY ROM:     Active  Right Eval Left Eval  Hip flexion    Hip extension    Hip abduction    Hip adduction    Hip internal rotation    Hip external rotation    Knee flexion    Knee extension    Ankle dorsiflexion    Ankle plantarflexion    Ankle inversion    Ankle eversion     (Blank rows = not tested)  LOWER EXTREMITY MMT:    MMT Right Eval Left Eval  Hip flexion    Hip extension    Hip abduction    Hip adduction    Hip internal rotation    Hip external rotation    Knee flexion    Knee extension    Ankle dorsiflexion    Ankle plantarflexion    Ankle inversion    Ankle eversion    (Blank rows = not tested)  TRANSFERS: {transfers eval:32620}  GAIT: Findings: Assistive device utilized:{Assistive devices:23999}, Level of assistance: {Levels of assistance:24026}, and Comments: ***  FUNCTIONAL TESTS:  {Functional tests:24029}  PATIENT SURVEYS:  {rehab surveys:24030}                                                                                                                              TREATMENT DATE: ***    PATIENT EDUCATION: Education details: *** Person educated: {Person educated:25204} Education method: {Education Method:25205} Education comprehension: {Education Comprehension:25206}  HOME EXERCISE PROGRAM: ***  GOALS: Goals reviewed with patient? Yes  SHORT TERM GOALS: Target date: {follow up:25551}  Patient to be independent with initial HEP. Baseline: HEP initiated Goal status: {GOALSTATUS:25110}    LONG TERM GOALS: Target date: {follow up:25551}  Patient to be independent with advanced HEP. Baseline: Not yet initiated  Goal status: {GOALSTATUS:25110}  Patient to demonstrate B LE strength >/=4+/5.  Baseline: See above Goal status: {GOALSTATUS:25110}  Patient to demonstrate *** ROM WFL and without pain limiting.   Baseline: *** Goal status: {GOALSTATUS:25110}  Patient to report and demonstrate improved head, neck, and shoulder  posture at rest and with activity.  Baseline: *** Goal status: {GOALSTATUS:25110}  Patient to demonstrate alternating reciprocal pattern when ascending and descending stairs with good stability and 1 handrail as needed.   Baseline: Unable Goal status: {GOALSTATUS:25110}  Patient to score at least 20/24 on DGI in order to decrease risk of falls.  Baseline: *** Goal status: {GOALSTATUS:25110}  Patient to complete TUG in <14 sec with LRAD in order to decrease risk of falls.   Baseline: *** Goal status: {GOALSTATUS:25110}  Patient to demonstrate 5xSTS test in <15 sec in order to decrease risk of falls.  Baseline: *** Goal status: {GOALSTATUS:25110}  Patient to score at least ***/56 on Berg in order to decrease risk of falls.  Baseline: *** Goal status: {GOALSTATUS:25110}  Patient to score at least *** on FOTO in order to indicate improved functional outcomes.  Baseline: *** Goal status: {GOALSTATUS:25110}  ASSESSMENT:  CLINICAL IMPRESSION:  Patient is an 78 y/o F presenting to OPPT with c/o *** for the past ***  Patient today presenting with ***.    Patient was educated on gentle *** HEP and reported understanding. Prior to current episode, patient was independent. Would benefit from skilled PT services *** x/week for *** weeks to address aforementioned impairments in order to optimize level of function.    OBJECTIVE IMPAIRMENTS: {opptimpairments:25111}.   ACTIVITY LIMITATIONS: {activitylimitations:27494}  PARTICIPATION LIMITATIONS: {participationrestrictions:25113}  PERSONAL FACTORS: {Personal factors:25162} are also affecting patient's functional outcome.   REHAB POTENTIAL: {rehabpotential:25112}  CLINICAL DECISION MAKING: {clinical decision making:25114}  EVALUATION COMPLEXITY: {Evaluation complexity:25115}  PLAN:  PT FREQUENCY: {rehab  frequency:25116}  PT DURATION: {rehab duration:25117}  PLANNED INTERVENTIONS: {rehab planned interventions:25118::"97110-Therapeutic exercises","97530- Therapeutic [phone]- Neuromuscular re-education","97535- Self JXBJ","47829- Manual therapy"}  PLAN FOR NEXT SESSION: ***

## 2024-03-03 ENCOUNTER — Ambulatory Visit: Admitting: Physical Therapy

## 2024-03-10 ENCOUNTER — Ambulatory Visit: Attending: Family Medicine

## 2024-03-10 DIAGNOSIS — M79671 Pain in right foot: Secondary | ICD-10-CM | POA: Insufficient documentation

## 2024-03-10 DIAGNOSIS — R2689 Other abnormalities of gait and mobility: Secondary | ICD-10-CM | POA: Diagnosis not present

## 2024-03-10 DIAGNOSIS — R29898 Other symptoms and signs involving the musculoskeletal system: Secondary | ICD-10-CM

## 2024-03-10 DIAGNOSIS — R2681 Unsteadiness on feet: Secondary | ICD-10-CM

## 2024-03-10 DIAGNOSIS — R0781 Pleurodynia: Secondary | ICD-10-CM | POA: Insufficient documentation

## 2024-03-10 DIAGNOSIS — M79642 Pain in left hand: Secondary | ICD-10-CM | POA: Diagnosis not present

## 2024-03-10 DIAGNOSIS — M25562 Pain in left knee: Secondary | ICD-10-CM | POA: Insufficient documentation

## 2024-03-10 NOTE — Therapy (Signed)
 OUTPATIENT PHYSICAL THERAPY NEURO EVALUATION   Patient Name: Diane Decker MRN: [id number] DOB:[birth year], 78 y.o., female Today's Date: 03/11/2024   PCP: Zilphia Hilt, Charyl Coppersmith, MD  REFERRING PROVIDER: Syliva Even, MD  END OF SESSION:  PT End of Session - 03/10/24 1403     Visit Number 1    Number of Visits 13    Date for PT Re-Evaluation 04/22/24    Authorization Type [employer]    Authorization Time Period auth required    Progress Note Due on Visit 10    PT Start Time 1400    PT Stop Time 1445    PT Time Calculation (min) 45 min             Past Medical History:  Diagnosis Date   Allergy    Arthritis    Barrett's esophagus    Cancer (HCC)    skin cancer   Chicken pox    Depression    Diabetes mellitus without complication (HCC)    Elevated LFTs    Family history of polyps in the colon    GERD (gastroesophageal reflux disease)    Gout    Hypertension    Osteopenia    Sleep apnea    borderline- no cpap use    Past Surgical History:  Procedure Laterality Date   ABDOMINAL HYSTERECTOMY     BREAST BIOPSY  2007   COLONOSCOPY     KNEE ARTHROSCOPY Left    ~15 yrs ago    POLYPECTOMY     TONSILLECTOMY  1949   UPPER GASTROINTESTINAL ENDOSCOPY     Patient Active Problem List   Diagnosis Date Noted   Type 2 diabetes mellitus with stage 3a chronic kidney disease, without long-term current use of insulin (HCC) 01/01/2024   Type 2 diabetes mellitus with mild nonproliferative retinopathy without macular edema, without long-term current use of insulin (HCC) 01/01/2024   Chronic idiopathic gout involving toe of left foot without tophus 11/06/2023   Elevated LFTs 07/03/2023   Synovial osteochondromatosis of shoulder 01/03/2023   Chronic right shoulder pain 01/03/2023   Type 2 diabetes mellitus without complication, without long-term current use of insulin (HCC) 12/18/2021   Adenomatous colon polyp 10/12/2019   Age-related osteoporosis  without current pathological fracture 10/12/2019   Edema 10/12/2019   Barrett esophagus 10/12/2019   Insomnia 10/12/2019   Postmenopausal 10/12/2019   HTN (hypertension) 09/29/2019   DM (diabetes mellitus), type 2, uncontrolled 09/29/2019   Depression, recurrent (HCC) 09/29/2019   Hyperlipidemia associated with type 2 diabetes mellitus (HCC) 09/29/2019   Morbid obesity (HCC) 09/29/2019   B12 deficiency 03/12/2017   Vitamin D  deficiency 03/12/2017   OSA (obstructive sleep apnea) 05/03/2016   BCC (basal cell carcinoma of skin) 06/16/2014   Presbyopia 05/20/2014   Senile nuclear sclerosis 05/20/2014   Transaminitis 01/14/2014   Type 2 diabetes mellitus without complication (HCC) 04/22/2003    ONSET DATE: 01/29/24  REFERRING DIAG: W41.324 (ICD-10-CM) - Acute pain of left knee M79.671 (ICD-10-CM) - Right foot pain M79.642 (ICD-10-CM) - Left hand pain R07.81 (ICD-10-CM) - Rib pain on left side  THERAPY DIAG:  Unsteadiness on feet  Other symptoms and signs involving the musculoskeletal system  Other abnormalities of gait and mobility  Rationale for Evaluation and Treatment: Rehabilitation  SUBJECTIVE:  SUBJECTIVE STATEMENT: Had a fall about 6 weeks ago when moving laundry basket and fell to left side and caused pain and soreness to left knee and shoulder, no grievous injuries sustained but still having lingering discomfort to bilateral shoulders and left knee. For about a month post-injury she reports mostly sedentary as she was recovering.  Pt reports overall improvement in her condition as she can now bear weight/kneel on left knee but movements of the shoulders such as throwing covers back or pulling, and overhead movements are still difficult painful R > L  Enjoys gardening and working outdoors  and notes some issues with navigating uneven surfaces and stairs and difficulty with walking and carrying items   Pt accompanied by: self  PERTINENT HISTORY: Skin CA, depression, DM, gout, HTN, L knee scope  PAIN:  Are you having pain? Yes: NPRS scale: pain in the shoulders, requires to stop activity Pain location: bilat shoulders radiating into arms-elbows Pain description: ache, sore, pain Aggravating factors: lifting, sidelying Relieving factors: stopping activity  PRECAUTIONS: None  RED FLAGS: None   WEIGHT BEARING RESTRICTIONS: No  FALLS: Has patient fallen in last 6 months? Yes. Number of falls 1  LIVING ENVIRONMENT: Lives with: lives alone Lives in: House/apartment Stairs: stairs to enter home Has following equipment at home: None  PLOF: Independent  PATIENT GOALS: improve bodily pains and improve balance if needed.   OBJECTIVE:  Note: Objective measures were completed at Evaluation unless otherwise noted.  DIAGNOSTIC FINDINGS: 01/29/24 L hand xray: Severe thumb carpometacarpal osteoarthritis. 2-5 DIP and PIP OA  01/29/24 Ribs xray: Minimal blunting of the left costophrenic angle may represent a small pleural effusion versus chronic scarring.   COGNITION: Overall cognitive status: Within functional limits for tasks assessed   SENSATION: WFL  EDEMA:    POSTURE: rounded shoulders, forward head, and flexed trunk   LOWER EXTREMITY ROM:     WNL  UE ROM: painful arc RUE, +Full can RUE, pain with resisted external rotation  LOWER EXTREMITY MMT:    5/5 BLE  TRANSFERS: Independent  GAIT: Findings: Assistive device utilized:None, Level of assistance: Complete Independence, and Comments:    FUNCTIONAL TESTS:  5 times sit to stand: 22 sec Timed up and go (TUG): 13 sec Berg Balance Scale: TBD Dynamic Gait Index: TBD  M-CTSIB  Condition 1: Firm Surface, EO 30 Sec, Mild Sway  Condition 2: Firm Surface, EC 30 Sec, Moderate Sway  Condition 3: Foam  Surface, EO 30 Sec, Moderate Sway  Condition 4: Foam Surface, EC 10 Sec, Severe Sway                                                                                                                                  TREATMENT DATE: 03/10/24    PATIENT EDUCATION: Education details: assessment details, HEP initiation Person educated: Patient Education method: Explanation Education comprehension: verbalized understanding  HOME EXERCISE PROGRAM: Access Code: [email or web address] URL: [email or web address] Date: 03/10/2024  Prepared by: Kelly Lovenia Debruler  Exercises - Seated Scapular Retraction  - 1 x daily - 7 x weekly - 3-5 sets - 10 reps - 3 sec hold  GOALS: Goals reviewed with patient? Yes  SHORT TERM GOALS: Target date: 04/01/2024  Patient to be independent with initial HEP. Baseline: HEP initiated Goal status: INITIAL    LONG TERM GOALS: Target date: 04/22/2024  Patient to be independent with advanced HEP. Baseline: Not yet initiated  Goal status: INITIAL    Patient to demonstrate RUE ROM WFL and without pain limiting.  Baseline: right shoulder painful arc/+Full Cane Goal status: INITIAL  Patient to report and demonstrate improved head, neck, and shoulder posture at rest and with activity and shoulder pain not awakening at night Baseline: sidelying painful Goal status: INITIAL  Patient to demonstrate alternating reciprocal pattern when ascending and descending stairs with good stability and 1 handrail as needed.   Baseline: Unable Goal status: INITIAL  Patient to score at least 20/24 on DGI in order to decrease risk of falls.  Baseline: TBD Goal status: INITIAL   Patient to demonstrate 5xSTS test in <15 sec in order to decrease risk of falls.  Baseline: 22 sec Goal status: INITIAL  Patient to score at least 50/56 on Berg in order to decrease risk of falls.  Baseline: TBD Goal status: INITIAL    ASSESSMENT:  CLINICAL IMPRESSION:  Patient is an 78 y/o F  presenting to OPPT with c/o multiple orthopedic issues following recent hx of fall with right shoulder being most prominent or for the past 6 weeks  Patient today presenting with RUE painful and limited shoulder ROM, postural deviations, reduced activity tolerance, balance deficits, LE weakness and risk for falls per 5xSTS test, postural instability per performance M-CTSIB,  and decreased ability to safely ambulate on uneven surfaces limiting participation in her typical activities such as gardening and home upkeep.    Patient was educated on gentle postural HEP and reported understanding. Prior to current episode, patient was independent. Would benefit from skilled PT services 1-2 x/week for 6 weeks to address aforementioned impairments in order to optimize level of function.    OBJECTIVE IMPAIRMENTS: decreased activity tolerance, decreased balance, decreased mobility, decreased ROM, decreased strength, impaired UE functional use, improper body mechanics, postural dysfunction, and pain.   ACTIVITY LIMITATIONS: carrying, lifting, squatting, stairs, reach over head, and locomotion level  PARTICIPATION LIMITATIONS: meal prep, cleaning, community activity, and yard work  PERSONAL FACTORS: Age, Time since onset of injury/illness/exacerbation, and 1-2 comorbidities: PMH are also affecting patient's functional outcome.   REHAB POTENTIAL: Excellent  CLINICAL DECISION MAKING: Stable/uncomplicated  EVALUATION COMPLEXITY: Low  PLAN:  PT FREQUENCY: 1-2x/week  PT DURATION: 6 weeks  PLANNED INTERVENTIONS: 97750- Physical Performance Testing, 97110-Therapeutic exercises, 97530- Therapeutic activity, [id number]- Neuromuscular re-education, 97535- Self Care, 40981- Manual therapy, [id number]- Gait training, [phone]- Aquatic Therapy, [phone]- Electrical stimulation (unattended), Taping, and Dry Needling  PLAN FOR NEXT SESSION: Berg, DGI, t-band rows for scap strength/posture, corner balance  7:52 AM, 03/11/24 M.  Kelly Mackenzey Crownover, PT, DPT Physical Therapist- [place] Office Number: [phone]    Referring diagnosis? M25.562 (ICD-10-CM) - Acute pain of left knee M79.671 (ICD-10-CM) - Right foot pain M79.642 (ICD-10-CM) - Left hand pain R07.81 (ICD-10-CM) - Rib pain on left side Treatment diagnosis? (if different than referring diagnosis) Unsteadiness on feet  Other symptoms and signs involving the musculoskeletal system  Other abnormalities of gait and mobility What was this (referring dx) caused by? []  Surgery [x]  Fall []  Ongoing issue []   Arthritis []  Other: ____________  Laterality: []  Rt []  Lt [x]  Both  Check all possible CPT codes:  *CHOOSE 10 OR LESS*    See Planned Interventions listed in the Plan section of the Evaluation.

## 2024-03-12 ENCOUNTER — Ambulatory Visit

## 2024-03-12 DIAGNOSIS — R29898 Other symptoms and signs involving the musculoskeletal system: Secondary | ICD-10-CM | POA: Diagnosis not present

## 2024-03-12 DIAGNOSIS — M79642 Pain in left hand: Secondary | ICD-10-CM | POA: Diagnosis not present

## 2024-03-12 DIAGNOSIS — R2681 Unsteadiness on feet: Secondary | ICD-10-CM

## 2024-03-12 DIAGNOSIS — M25562 Pain in left knee: Secondary | ICD-10-CM | POA: Diagnosis not present

## 2024-03-12 DIAGNOSIS — R2689 Other abnormalities of gait and mobility: Secondary | ICD-10-CM

## 2024-03-12 DIAGNOSIS — M79671 Pain in right foot: Secondary | ICD-10-CM | POA: Diagnosis not present

## 2024-03-12 DIAGNOSIS — R0781 Pleurodynia: Secondary | ICD-10-CM | POA: Diagnosis not present

## 2024-03-12 NOTE — Therapy (Signed)
 OUTPATIENT PHYSICAL THERAPY NEURO TREATMENT   Patient Name: Diane Decker MRN: [id number] DOB:[birth year], 78 y.o., female Today's Date: 03/12/2024   PCP: Zilphia Hilt, Charyl Coppersmith, MD  REFERRING PROVIDER: Syliva Even, MD  END OF SESSION:  PT End of Session - 03/12/24 1404     Visit Number 2    Number of Visits 13    Date for PT Re-Evaluation 04/22/24    Authorization Type [employer]    Authorization Time Period auth required    Progress Note Due on Visit 10    PT Start Time 1404    PT Stop Time 1445    PT Time Calculation (min) 41 min             Past Medical History:  Diagnosis Date   Allergy    Arthritis    Barrett's esophagus    Cancer (HCC)    skin cancer   Chicken pox    Depression    Diabetes mellitus without complication (HCC)    Elevated LFTs    Family history of polyps in the colon    GERD (gastroesophageal reflux disease)    Gout    Hypertension    Osteopenia    Sleep apnea    borderline- no cpap use    Past Surgical History:  Procedure Laterality Date   ABDOMINAL HYSTERECTOMY     BREAST BIOPSY  2007   COLONOSCOPY     KNEE ARTHROSCOPY Left    ~15 yrs ago    POLYPECTOMY     TONSILLECTOMY  1949   UPPER GASTROINTESTINAL ENDOSCOPY     Patient Active Problem List   Diagnosis Date Noted   Type 2 diabetes mellitus with stage 3a chronic kidney disease, without long-term current use of insulin (HCC) 01/01/2024   Type 2 diabetes mellitus with mild nonproliferative retinopathy without macular edema, without long-term current use of insulin (HCC) 01/01/2024   Chronic idiopathic gout involving toe of left foot without tophus 11/06/2023   Elevated LFTs 07/03/2023   Synovial osteochondromatosis of shoulder 01/03/2023   Chronic right shoulder pain 01/03/2023   Type 2 diabetes mellitus without complication, without long-term current use of insulin (HCC) 12/18/2021   Adenomatous colon polyp 10/12/2019   Age-related osteoporosis  without current pathological fracture 10/12/2019   Edema 10/12/2019   Barrett esophagus 10/12/2019   Insomnia 10/12/2019   Postmenopausal 10/12/2019   HTN (hypertension) 09/29/2019   DM (diabetes mellitus), type 2, uncontrolled 09/29/2019   Depression, recurrent (HCC) 09/29/2019   Hyperlipidemia associated with type 2 diabetes mellitus (HCC) 09/29/2019   Morbid obesity (HCC) 09/29/2019   B12 deficiency 03/12/2017   Vitamin D  deficiency 03/12/2017   OSA (obstructive sleep apnea) 05/03/2016   BCC (basal cell carcinoma of skin) 06/16/2014   Presbyopia 05/20/2014   Senile nuclear sclerosis 05/20/2014   Transaminitis 01/14/2014   Type 2 diabetes mellitus without complication (HCC) 04/22/2003    ONSET DATE: 01/29/24  REFERRING DIAG: Y86.578 (ICD-10-CM) - Acute pain of left knee M79.671 (ICD-10-CM) - Right foot pain M79.642 (ICD-10-CM) - Left hand pain R07.81 (ICD-10-CM) - Rib pain on left side  THERAPY DIAG:  Unsteadiness on feet  Other symptoms and signs involving the musculoskeletal system  Other abnormalities of gait and mobility  Rationale for Evaluation and Treatment: Rehabilitation  SUBJECTIVE:  SUBJECTIVE STATEMENT: Arms at night.  Pt accompanied by: self  PERTINENT HISTORY: Skin CA, depression, DM, gout, HTN, L knee scope  PAIN:  Are you having pain? Yes: NPRS scale: pain in the shoulders, requires to stop activity Pain location: bilat shoulders radiating into arms-elbows Pain description: ache, sore, pain Aggravating factors: lifting, sidelying Relieving factors: stopping activity  PRECAUTIONS: None  RED FLAGS: None   WEIGHT BEARING RESTRICTIONS: No  FALLS: Has patient fallen in last 6 months? Yes. Number of falls 1  LIVING ENVIRONMENT: Lives with: lives alone Lives in:  House/apartment Stairs: stairs to enter home Has following equipment at home: None  PLOF: Independent  PATIENT GOALS: improve bodily pains and improve balance if needed.   OBJECTIVE:   TODAY'S TREATMENT: 03/12/24 Activity Comments  Berg Balance Test 50/56  Dynamic Gait Index 21/24  Standing rows 3x10 Blue t-band  Standing external rotation 3x10 Red band           [facility] PT Assessment - 03/12/24 0001       Standardized Balance Assessment   Standardized Balance Assessment Berg Balance Test;Dynamic Gait Index      Berg Balance Test   Sit to Stand Able to stand without using hands and stabilize independently    Standing Unsupported Able to stand safely 2 minutes    Sitting with Back Unsupported but Feet Supported on Floor or Stool Able to sit safely and securely 2 minutes    Stand to Sit Sits safely with minimal use of hands    Transfers Able to transfer safely, minor use of hands    Standing Unsupported with Eyes Closed Able to stand 10 seconds safely    Standing Unsupported with Feet Together Able to place feet together independently and stand 1 minute safely    From Standing, Reach Forward with Outstretched Arm Can reach confidently >25 cm (10")    From Standing Position, Pick up Object from Floor Able to pick up shoe safely and easily    From Standing Position, Turn to Look Behind Over each Shoulder Looks behind from both sides and weight shifts well    Turn 360 Degrees Able to turn 360 degrees safely in 4 seconds or less    Standing Unsupported, Alternately Place Feet on Step/Stool Able to stand independently and safely and complete 8 steps in 20 seconds    Standing Unsupported, One Foot in [employer] balance while stepping or standing    Standing on One Leg Able to lift leg independently and hold equal to or more than 3 seconds    Total Score 50      Dynamic Gait Index   Level Surface Normal    Change in Gait Speed Mild Impairment    Gait with Horizontal Head Turns Normal     Gait with Vertical Head Turns Normal    Gait and Pivot Turn Normal    Step Over Obstacle Mild Impairment    Step Around Obstacles Normal    Steps Mild Impairment    Total Score 21             PATIENT EDUCATION: Education details: assessment details, HEP initiation Person educated: Patient Education method: Explanation Education comprehension: verbalized understanding  HOME EXERCISE PROGRAM: Access Code: [email or web address] URL: [email or web address] Date: 03/10/2024 Prepared by: Kelly Nyle Limb  Exercises - Seated Scapular Retraction  - 1 x daily - 7 x weekly - 3-5 sets - 10 reps - 3 sec hold - Standing Shoulder Row with Anchored Resistance  - 2-3 x  weekly - 3 sets - 10 reps - Shoulder External Rotation and Scapular Retraction with Resistance  - 2-3 x weekly - 3 sets - 10 reps  Note: Objective measures were completed at Evaluation unless otherwise noted.  DIAGNOSTIC FINDINGS: 01/29/24 L hand xray: Severe thumb carpometacarpal osteoarthritis. 2-5 DIP and PIP OA  01/29/24 Ribs xray: Minimal blunting of the left costophrenic angle may represent a small pleural effusion versus chronic scarring.   COGNITION: Overall cognitive status: Within functional limits for tasks assessed   SENSATION: WFL  EDEMA:    POSTURE: rounded shoulders, forward head, and flexed trunk   LOWER EXTREMITY ROM:     WNL  UE ROM: painful arc RUE, +Full can RUE, pain with resisted external rotation  LOWER EXTREMITY MMT:    5/5 BLE  TRANSFERS: Independent  GAIT: Findings: Assistive device utilized:None, Level of assistance: Complete Independence, and Comments:    FUNCTIONAL TESTS:  5 times sit to stand: 22 sec Timed up and go (TUG): 13 sec Berg Balance Scale: 50/56 Dynamic Gait Index: 21/24 10 Meter walk test: 11.62 sec = 2.8 ft/sec  M-CTSIB  Condition 1: Firm Surface, EO 30 Sec, Mild Sway  Condition 2: Firm Surface, EC 30 Sec, Moderate Sway  Condition 3: Foam Surface, EO 30  Sec, Moderate Sway  Condition 4: Foam Surface, EC 10 Sec, Severe Sway                                                                                                                                  TREATMENT DATE: 03/10/24      GOALS: Goals reviewed with patient? Yes  SHORT TERM GOALS: Target date: 04/02/2024  Patient to be independent with initial HEP. Baseline: HEP initiated Goal status: INITIAL    LONG TERM GOALS: Target date: 04/23/2024  Patient to be independent with advanced HEP. Baseline: Not yet initiated  Goal status: INITIAL    Patient to demonstrate RUE ROM WFL and without pain limiting.  Baseline: right shoulder painful arc/+Full Cane Goal status: INITIAL  Patient to report and demonstrate improved head, neck, and shoulder posture at rest and with activity and shoulder pain not awakening at night Baseline: sidelying painful Goal status: INITIAL  Patient to demonstrate alternating reciprocal pattern when ascending and descending stairs with good stability and 1 handrail as needed.   Baseline: Unable Goal status: INITIAL  Patient to score at least 20/24 on DGI in order to decrease risk of falls.  Baseline: 21/24 Goal status: MET   Patient to demonstrate 5xSTS test in <15 sec in order to decrease risk of falls.  Baseline: 22 sec Goal status: INITIAL  Patient to score at least 50/56 on Berg in order to decrease risk of falls.  Baseline: 50/56 Goal status: MET    ASSESSMENT:  CLINICAL IMPRESSION:  Berg Balance Test with score 50/56 indicating low risk for falls. Dynamic Gait Index score 21/24 indicating low risk for falls.  Her  chief difficulty under balance challenges in these respects were aspects that require single limb support and exibits difficulty with foot clearance stepping over obstacles.  Pt endorses symptoms of increased radiating back-buttock pain with prolonged standing/walking and is relieved by sitting and/or bending forward as when  walking with shopping cart.  Continued with treatment to address her bilateral shoulder pain R>L with scapular strengthening to address rounded shoulder posture and +full can on right and painful external rotation.  Tolerated activities well and would benefit from ongoing services to address balance deficits and shoulder dysfunction to improve activity tolerance and comfort for sleeping.  OBJECTIVE IMPAIRMENTS: decreased activity tolerance, decreased balance, decreased mobility, decreased ROM, decreased strength, impaired UE functional use, improper body mechanics, postural dysfunction, and pain.   ACTIVITY LIMITATIONS: carrying, lifting, squatting, stairs, reach over head, and locomotion level  PARTICIPATION LIMITATIONS: meal prep, cleaning, community activity, and yard work  PERSONAL FACTORS: Age, Time since onset of injury/illness/exacerbation, and 1-2 comorbidities: PMH are also affecting patient's functional outcome.   REHAB POTENTIAL: Excellent  CLINICAL DECISION MAKING: Stable/uncomplicated  EVALUATION COMPLEXITY: Low  PLAN:  PT FREQUENCY: 1-2x/week  PT DURATION: 6 weeks  PLANNED INTERVENTIONS: 97750- Physical Performance Testing, 97110-Therapeutic exercises, 97530- Therapeutic activity, [id number]- Neuromuscular re-education, 97535- Self Care, 29562- Manual therapy, [phone]- Gait training, [phone]- Aquatic Therapy, [phone]- Electrical stimulation (unattended), Taping, and Dry Needling  PLAN FOR NEXT SESSION: corner balance, HEP review, demo of gym machines/cable rows for progressing shoulder/scap strength (she goes to [facility] for water aerobics)  2:04 PM, 03/12/24 M. Kelly Ithiel Liebler, PT, DPT Physical Therapist- [place] Office Number: [phone]

## 2024-03-18 ENCOUNTER — Other Ambulatory Visit: Payer: Self-pay | Admitting: Internal Medicine

## 2024-03-18 ENCOUNTER — Encounter: Payer: Self-pay | Admitting: Internal Medicine

## 2024-03-18 DIAGNOSIS — E1165 Type 2 diabetes mellitus with hyperglycemia: Secondary | ICD-10-CM

## 2024-03-19 ENCOUNTER — Other Ambulatory Visit: Payer: Self-pay

## 2024-03-19 DIAGNOSIS — E1165 Type 2 diabetes mellitus with hyperglycemia: Secondary | ICD-10-CM

## 2024-03-19 MED ORDER — METFORMIN HCL 500 MG PO TABS
500.0000 mg | ORAL_TABLET | Freq: Two times a day (BID) | ORAL | 3 refills | Status: DC
Start: 2024-03-19 — End: 2024-05-13

## 2024-03-24 ENCOUNTER — Ambulatory Visit

## 2024-03-26 ENCOUNTER — Ambulatory Visit

## 2024-03-27 ENCOUNTER — Other Ambulatory Visit: Payer: Self-pay | Admitting: Internal Medicine

## 2024-03-27 DIAGNOSIS — E1165 Type 2 diabetes mellitus with hyperglycemia: Secondary | ICD-10-CM

## 2024-03-31 ENCOUNTER — Ambulatory Visit

## 2024-04-02 ENCOUNTER — Ambulatory Visit

## 2024-05-04 ENCOUNTER — Ambulatory Visit: Payer: Self-pay

## 2024-05-04 NOTE — Telephone Encounter (Signed)
 Noted

## 2024-05-04 NOTE — Telephone Encounter (Signed)
 FYI Only or Action Required?: FYI only for provider.  Patient was last seen in primary care on 10/31/2023 by Luke Chiquita SAUNDERS, DO.  Called Nurse Triage reporting Leg Swelling and Foot Swelling.  Symptoms began Bothseveral weeks ago.  Interventions attempted: Prescription medications: Medication changes made by physicion and Other: Compression Socks.  Symptoms are: unchanged.  Triage Disposition: See Physician Within 24 Hours  Patient/caregiver understands and will follow disposition?: Yes   Copied from CRM [id number]. Topic: Clinical - Red Word Triage >> May 04, 2024  9:14 AM Larissa RAMAN wrote: [facility] that prompted transfer to Nurse Triage: Swelling in both legs and feet Reason for Disposition  [1] MODERATE leg swelling (e.g., swelling extends up to knees) AND [2] new-onset or getting worse  Answer Assessment - Initial Assessment Questions 1. ONSET: When did the swelling start? (e.g., minutes, hours, days)     Since Last Visit  2. LOCATION: What part of the leg is swollen?  Are both legs swollen or just one leg?     Both Legs and Ankles  3. SEVERITY: How bad is the swelling? (e.g., localized; mild, moderate, severe)     Moderate  4. REDNESS: Is there redness or signs of infection?     No  5. PAIN: Is the swelling painful to touch? If Yes, ask: How painful is it?   (Scale 1-10; mild, moderate or severe)     No  6. FEVER: Do you have a fever? If Yes, ask: What is it, how was it measured, and when did it start?      No  7. CAUSE: What do you think is causing the leg swelling?     Unsure  8. MEDICAL HISTORY: Do you have a history of blood clots (e.g., DVT), cancer, heart failure, kidney disease, or liver failure?     Hypertension  9. RECURRENT SYMPTOM: Have you had leg swelling before? If Yes, ask: When was the last time? What happened that time?     Yes  10. OTHER SYMPTOMS: Do you have any other symptoms? (e.g., chest pain, difficulty breathing)        Tender spot on Left Breast  11. PREGNANCY: Is there any chance you are pregnant? When was your last menstrual period?       No and No  Protocols used: Leg Swelling and Edema-A-AH

## 2024-05-05 ENCOUNTER — Encounter: Payer: Self-pay | Admitting: Internal Medicine

## 2024-05-05 ENCOUNTER — Ambulatory Visit: Admitting: Internal Medicine

## 2024-05-05 VITALS — BP 130/70 | HR 70 | Temp 98.1°F | Wt 236.6 lb

## 2024-05-05 DIAGNOSIS — R600 Localized edema: Secondary | ICD-10-CM

## 2024-05-05 DIAGNOSIS — R5382 Chronic fatigue, unspecified: Secondary | ICD-10-CM

## 2024-05-05 NOTE — Progress Notes (Signed)
 Established Patient Office Visit     CC/Reason for Visit: Bilateral lower extremity edema and fatigue  HPI: Diane Decker is a 78 y.o. female who is coming in today for the above mentioned reasons. Past Medical History is significant for: Hypertension, hyperlipidemia, type 2 diabetes followed by endocrinology now on Mounjaro , GERD, morbid obesity, Barrett's esophagus.  She also has a history of chronic venous insufficiency.  During the summer she has not been wearing her compression stockings as much due to the heat.  She had an echocardiogram in August 2024 that showed an ejection fraction of 55%, normal left ventricular function, no wall motion abnormalities and no diastolic dysfunction.  For months she has been feeling chronic fatigue.  She feels like she has to take frequent naps throughout the day.  She does not feel like she wakes frequently at nighttime.  She states it has been over 10 years since her last sleep study.   Past Medical/Surgical History: Past Medical History:  Diagnosis Date   Allergy    Arthritis    Barrett's esophagus    Cancer (HCC)    skin cancer   Chicken pox    Depression    Diabetes mellitus without complication (HCC)    Elevated LFTs    Family history of polyps in the colon    GERD (gastroesophageal reflux disease)    Gout    Hypertension    Osteopenia    Sleep apnea    borderline- no cpap use     Past Surgical History:  Procedure Laterality Date   ABDOMINAL HYSTERECTOMY     BREAST BIOPSY  2007   COLONOSCOPY     KNEE ARTHROSCOPY Left    ~15 yrs ago    POLYPECTOMY     TONSILLECTOMY  1949   UPPER GASTROINTESTINAL ENDOSCOPY      Social History:  reports that she has quit smoking. She has never used smokeless tobacco. She reports that she does not drink alcohol and does not use drugs.  Allergies: Allergies  Allergen Reactions   Penicillins Anaphylaxis   Whey Protein [Protein] Shortness Of Breath   Lisinopril Other (See  Comments) and Swelling   Losartan Potassium Swelling   Losartan Nausea And Vomiting    Family History:  Family History  Problem Relation Age of Onset   Breast cancer Mother    Lung cancer Father    Liver cancer Father    Arthritis Sister    Depression Sister    Hyperlipidemia Sister    Hypertension Sister    Alcohol abuse Brother    Arthritis Brother    Depression Brother    Hyperlipidemia Brother    Hypertension Brother    Cancer Daughter    COPD Daughter    Depression Son    Arthritis Maternal Grandmother    Depression Maternal Grandmother    Hyperlipidemia Maternal Grandmother    Hypertension Maternal Grandmother    Stroke Maternal Grandmother    Alcohol abuse Maternal Grandfather    Arthritis Maternal Grandfather    Cancer Maternal Grandfather    Hyperlipidemia Maternal Grandfather    Hypertension Maternal Grandfather    Stroke Maternal Grandfather    Arthritis Paternal Grandmother    Hyperlipidemia Paternal Grandmother    Hypertension Paternal Grandmother    Arthritis Paternal Grandfather    Alcohol abuse Paternal Grandfather    Diabetes Paternal Grandfather    Hyperlipidemia Paternal Grandfather    Heart disease Paternal Grandfather    Hearing loss Paternal  Grandfather    Stroke Paternal Grandfather    Colon polyps Neg Hx    Esophageal cancer Neg Hx    Rectal cancer Neg Hx    Stomach cancer Neg Hx      Current Outpatient Medications:    Accu-Chek Softclix Lancets lancets, Use once daily Dx E11.9, Disp: 100 each, Rfl: 0   allopurinol  (ZYLOPRIM ) 100 MG tablet, TAKE 1 TABLET BY MOUTH EVERY DAY, Disp: 90 tablet, Rfl: 1   aspirin 81 MG chewable tablet, Chew by mouth daily., Disp: , Rfl:    atenolol  (TENORMIN ) 50 MG tablet, TAKE 1 TABLET BY MOUTH EVERY DAY, Disp: 90 tablet, Rfl: 1   atorvastatin  (LIPITOR) 20 MG tablet, TAKE 1 TABLET BY MOUTH EVERY DAY, Disp: 90 tablet, Rfl: 0   Blood Glucose Monitoring Suppl (ACCU-CHEK GUIDE ME) w/Device KIT, Use daily for  glucose control . Dx E11.9, Disp: 1 kit, Rfl: 0   colchicine  0.6 MG tablet, Take 0.5 tablets (0.3 mg total) by mouth daily as needed (gout or psuedogout pain)., Disp: 30 tablet, Rfl: 2   cycloSPORINE (RESTASIS) 0.05 % ophthalmic emulsion, 1 drop 2 (two) times daily., Disp: , Rfl:    esomeprazole (NEXIUM) 20 MG capsule, Take 20 mg by mouth daily at 6 (six) AM., Disp: , Rfl:    glipiZIDE  (GLUCOTROL ) 5 MG tablet, TAKE 0.5 TABLETS (2.5 MG TOTAL) BY MOUTH 2 (TWO) TIMES DAILY BEFORE A MEAL., Disp: 90 tablet, Rfl: 1   glucose blood (ACCU-CHEK GUIDE) test strip, Test once daily for glucose control.  Dx E11.9, Disp: 100 each, Rfl: 12   hydrochlorothiazide  (HYDRODIURIL ) 25 MG tablet, TAKE 1 TABLET (25 MG TOTAL) BY MOUTH DAILY., Disp: 90 tablet, Rfl: 1   HYDROcodone -acetaminophen  (NORCO/VICODIN) 5-325 MG tablet, Take 1 tablet by mouth every 6 (six) hours as needed., Disp: 15 tablet, Rfl: 0   metFORMIN  (GLUCOPHAGE ) 500 MG tablet, Take 1 tablet (500 mg total) by mouth 2 (two) times daily with a meal., Disp: 180 tablet, Rfl: 3   Multiple Vitamin (MULTIVITAMIN) capsule, Take 1 capsule by mouth daily., Disp: , Rfl:    tirzepatide  (MOUNJARO ) 5 MG/0.5ML Pen, Inject 5 mg into the skin once a week., Disp: 2 mL, Rfl: 11   traZODone  (DESYREL ) 150 MG tablet, TAKE 1/2 TABLET BY MOUTH EVERY EVENING, Disp: 45 tablet, Rfl: 1   vitamin E 1000 UNIT capsule, Take 1,000 Units by mouth daily., Disp: , Rfl:   Review of Systems:  Negative unless indicated in HPI.   Physical Exam: Vitals:   05/05/24 1603  BP: 130/70  Pulse: 70  Temp: 98.1 F (36.7 C)  TempSrc: Oral  SpO2: 97%  Weight: 236 lb 9.6 oz (107.3 kg)    Body mass index is 43.27 kg/m.   Physical Exam Vitals reviewed.  Constitutional:      Appearance: Normal appearance. She is obese.  HENT:     Head: Normocephalic and atraumatic.  Eyes:     Conjunctiva/sclera: Conjunctivae normal.  Cardiovascular:     Rate and Rhythm: Normal rate and regular rhythm.   Pulmonary:     Effort: Pulmonary effort is normal.     Breath sounds: Normal breath sounds.  Skin:    General: Skin is warm and dry.  Neurological:     General: No focal deficit present.     Mental Status: She is alert and oriented to person, place, and time.  Psychiatric:        Mood and Affect: Mood normal.  Behavior: Behavior normal.        Thought Content: Thought content normal.        Judgment: Judgment normal.      Impression and Plan:  Bilateral lower extremity edema  Chronic fatigue -     TSH; Future -     Vitamin B12; Future -     VITAMIN D  25 Hydroxy (Vit-D Deficiency, Fractures); Future -     CBC with Differential/Platelet; Future -     Comprehensive metabolic panel with GFR; Future -     IBC + Ferritin; Future   - Given normal heart and lung auscultation, recent normal echocardiogram, bilateral lower extremity edema is due to chronic venous insufficiency.  Have advised that she elevate legs and wear compression stockings as much as possible, also limit sodium intake. - In regards to chronic fatigue, will check labs to rule out severe iron deficiency, hypothyroidism, vitamin D  and B12 deficiencies.  This is likely multifactorial and related to her obesity, deconditioning, possibly a component of sleep apnea.  If labs are normal, consider updating sleep study.   Time spent:32 minutes reviewing chart, interviewing and examining patient and formulating plan of care.     Tully Theophilus Andrews, MD [facility]

## 2024-05-06 ENCOUNTER — Other Ambulatory Visit

## 2024-05-07 ENCOUNTER — Other Ambulatory Visit

## 2024-05-08 ENCOUNTER — Other Ambulatory Visit (INDEPENDENT_AMBULATORY_CARE_PROVIDER_SITE_OTHER)

## 2024-05-08 DIAGNOSIS — R5382 Chronic fatigue, unspecified: Secondary | ICD-10-CM

## 2024-05-08 LAB — COMPREHENSIVE METABOLIC PANEL WITH GFR
ALT: 21 U/L (ref 0–35)
AST: 19 U/L (ref 0–37)
Albumin: 4.1 g/dL (ref 3.5–5.2)
Alkaline Phosphatase: 90 U/L (ref 39–117)
BUN: 22 mg/dL (ref 6–23)
CO2: 28 meq/L (ref 19–32)
Calcium: 10.2 mg/dL (ref 8.4–10.5)
Chloride: 100 meq/L (ref 96–112)
Creatinine, Ser: 1.24 mg/dL — ABNORMAL HIGH (ref 0.40–1.20)
GFR: 41.13 mL/min — ABNORMAL LOW (ref >60.00)
Glucose, Bld: 112 mg/dL — ABNORMAL HIGH (ref 70–99)
Potassium: 4.4 meq/L (ref 3.5–5.1)
Sodium: 138 meq/L (ref 135–145)
Total Bilirubin: 0.6 mg/dL (ref 0.2–1.2)
Total Protein: 6.7 g/dL (ref 6.0–8.3)

## 2024-05-08 LAB — VITAMIN D 25 HYDROXY (VIT D DEFICIENCY, FRACTURES): VITD: 42.68 ng/mL (ref 30.00–100.00)

## 2024-05-08 LAB — CBC WITH DIFFERENTIAL/PLATELET
Basophils Absolute: 0 K/uL (ref 0.0–0.1)
Basophils Relative: 0.1 % (ref 0.0–3.0)
Eosinophils Absolute: 0.4 K/uL (ref 0.0–0.7)
Eosinophils Relative: 4.5 % (ref 0.0–5.0)
HCT: 37.1 % (ref 36.0–46.0)
Hemoglobin: 12.4 g/dL (ref 12.0–15.0)
Lymphocytes Relative: 24.3 % (ref 12.0–46.0)
Lymphs Abs: 2.3 K/uL (ref 0.7–4.0)
MCHC: 33.4 g/dL (ref 30.0–36.0)
MCV: 90.8 fl (ref 78.0–100.0)
Monocytes Absolute: 0.9 K/uL (ref 0.1–1.0)
Monocytes Relative: 9.4 % (ref 3.0–12.0)
Neutro Abs: 5.8 K/uL (ref 1.4–7.7)
Neutrophils Relative %: 61.7 % (ref 43.0–77.0)
Platelets: 228 K/uL (ref 150.0–400.0)
RBC: 4.08 Mil/uL (ref 3.87–5.11)
RDW: 13.6 % (ref 11.5–15.5)
WBC: 9.5 K/uL (ref 4.0–10.5)

## 2024-05-08 LAB — IBC + FERRITIN
Ferritin: 18.3 ng/mL (ref 10.0–291.0)
Iron: 96 ug/dL (ref 42–145)
Saturation Ratios: 23.3 % (ref 20.0–50.0)
TIBC: 411.6 ug/dL (ref 250.0–450.0)
Transferrin: 294 mg/dL (ref 212.0–360.0)

## 2024-05-08 LAB — VITAMIN B12: Vitamin B-12: 605 pg/mL (ref 211–911)

## 2024-05-08 LAB — TSH: TSH: 1.49 u[IU]/mL (ref 0.35–5.50)

## 2024-05-09 ENCOUNTER — Other Ambulatory Visit: Payer: Self-pay | Admitting: Internal Medicine

## 2024-05-12 ENCOUNTER — Ambulatory Visit: Payer: Self-pay | Admitting: Internal Medicine

## 2024-05-12 DIAGNOSIS — E1122 Type 2 diabetes mellitus with diabetic chronic kidney disease: Secondary | ICD-10-CM

## 2024-05-13 ENCOUNTER — Ambulatory Visit: Admitting: Internal Medicine

## 2024-05-13 VITALS — Ht 62.0 in | Wt 233.0 lb

## 2024-05-13 DIAGNOSIS — Z7985 Long-term (current) use of injectable non-insulin antidiabetic drugs: Secondary | ICD-10-CM | POA: Diagnosis not present

## 2024-05-13 DIAGNOSIS — Z7984 Long term (current) use of oral hypoglycemic drugs: Secondary | ICD-10-CM | POA: Diagnosis not present

## 2024-05-13 DIAGNOSIS — E1122 Type 2 diabetes mellitus with diabetic chronic kidney disease: Secondary | ICD-10-CM | POA: Diagnosis not present

## 2024-05-13 DIAGNOSIS — E113299 Type 2 diabetes mellitus with mild nonproliferative diabetic retinopathy without macular edema, unspecified eye: Secondary | ICD-10-CM | POA: Diagnosis not present

## 2024-05-13 DIAGNOSIS — N1831 Chronic kidney disease, stage 3a: Secondary | ICD-10-CM

## 2024-05-13 DIAGNOSIS — E1165 Type 2 diabetes mellitus with hyperglycemia: Secondary | ICD-10-CM

## 2024-05-13 LAB — POCT GLUCOSE (DEVICE FOR HOME USE): POC Glucose: 163 mg/dL — AB (ref 70–99)

## 2024-05-13 LAB — POCT GLYCOSYLATED HEMOGLOBIN (HGB A1C): Hemoglobin A1C: 7.1 % — AB (ref 4.0–5.6)

## 2024-05-13 MED ORDER — TIRZEPATIDE 7.5 MG/0.5ML ~~LOC~~ SOAJ: 7.5000 mg | SUBCUTANEOUS | 3 refills | Status: DC

## 2024-05-13 MED ORDER — GLIPIZIDE 5 MG PO TABS: 5.0000 mg | ORAL_TABLET | Freq: Every day | ORAL | 3 refills | Status: DC

## 2024-05-13 MED ORDER — METFORMIN HCL 500 MG PO TABS: 500.0000 mg | ORAL_TABLET | Freq: Two times a day (BID) | ORAL | 3 refills | Status: DC

## 2024-05-13 NOTE — Progress Notes (Signed)
 Name: Diane Decker  Age/ Sex: 78 y.o., female   MRN/ DOB: [id number], Nov 24, 1945     PCP: Theophilus Andrews, Tully GRADE, MD   Reason for Endocrinology Evaluation: Type 2 Diabetes Mellitus  Initial Endocrine Consultative Visit: 11/10/2020    PATIENT IDENTIFIER: Diane Decker is a 78 y.o. female with a past medical history of T2DM, barrett's esophagus. The patient has followed with Endocrinology clinic since 11/10/2020 for consultative assistance with management of her diabetes.  DIABETIC HISTORY:  Diane Decker was diagnosed with DM at age 83, ozempic  caused GI side effects. . Her hemoglobin A1c has ranged from 6.6%  in 2021, peaking at 9.7 %in 2021.    On her initial visit to our clinic her A1c was 6.6 % she was on Glipizide  XL, Metformin  and wanted to give ozempic  another try. We swiched XL glipizide  to regular release   Started Jardiance  06/2023 but was cost prohibitive    SUBJECTIVE:   During the last visit (01/01/2024): A1c 7.6%     Today (05/13/2024): Diane Decker  is here for a follow up on diabetes management.   She has been checking glucose 1-2x daily    Patient has been following up with sports medicine for joint pains  Denies nausea or vomiting  Denies constipation or diarrhea   HOME DIABETES REGIMEN:  Glipizide  5 mg,1 tab every morning  Metformin  500 mg , 1 tablets BID Mounjaro  5 mg once weekly     Statin: yes  ACE-I/ARB: Intolerant to lisinopril and losartan    CONTINUOUS GLUCOSE MONITORING RECORD INTERPRETATION : n/a    DIABETIC COMPLICATIONS: Microvascular complications:  CKD III, mild retinopathy Denies: neuropathy  Last Eye Exam: Completed 12/31/2023  Macrovascular complications:   Denies: CAD, CVA, PVD   HISTORY:  Past Medical History:  Past Medical History:  Diagnosis Date   Allergy    Arthritis    Barrett's esophagus    Cancer (HCC)    skin cancer   Chicken pox    Depression    Diabetes mellitus  without complication (HCC)    Elevated LFTs    Family history of polyps in the colon    GERD (gastroesophageal reflux disease)    Gout    Hypertension    Osteopenia    Sleep apnea    borderline- no cpap use    Past Surgical History:  Past Surgical History:  Procedure Laterality Date   ABDOMINAL HYSTERECTOMY     BREAST BIOPSY  2007   COLONOSCOPY     KNEE ARTHROSCOPY Left    ~15 yrs ago    POLYPECTOMY     TONSILLECTOMY  1949   UPPER GASTROINTESTINAL ENDOSCOPY     Social History:  reports that she has quit smoking. She has never used smokeless tobacco. She reports that she does not drink alcohol and does not use drugs. Family History:  Family History  Problem Relation Age of Onset   Breast cancer Mother    Lung cancer Father    Liver cancer Father    Arthritis Sister    Depression Sister    Hyperlipidemia Sister    Hypertension Sister    Alcohol abuse Brother    Arthritis Brother    Depression Brother    Hyperlipidemia Brother    Hypertension Brother    Cancer Daughter    COPD Daughter    Depression Son    Arthritis Maternal Grandmother    Depression Maternal Grandmother    Hyperlipidemia Maternal Grandmother  Hypertension Maternal Grandmother    Stroke Maternal Grandmother    Alcohol abuse Maternal Grandfather    Arthritis Maternal Grandfather    Cancer Maternal Grandfather    Hyperlipidemia Maternal Grandfather    Hypertension Maternal Grandfather    Stroke Maternal Grandfather    Arthritis Paternal Grandmother    Hyperlipidemia Paternal Grandmother    Hypertension Paternal Grandmother    Arthritis Paternal Grandfather    Alcohol abuse Paternal Grandfather    Diabetes Paternal Grandfather    Hyperlipidemia Paternal Grandfather    Heart disease Paternal Grandfather    Hearing loss Paternal Grandfather    Stroke Paternal Grandfather    Colon polyps Neg Hx    Esophageal cancer Neg Hx    Rectal cancer Neg Hx    Stomach cancer Neg Hx      HOME  MEDICATIONS: Allergies as of 05/13/2024       Reactions   Penicillins Anaphylaxis   Whey Protein [protein] Shortness Of Breath   Lisinopril Other (See Comments), Swelling   Losartan Potassium Swelling   Losartan Nausea And Vomiting        Medication List        Accurate as of May 13, 2024  2:27 PM. If you have any questions, ask your nurse or doctor.          Accu-Chek Guide Me w/Device Kit Use daily for glucose control . Dx E11.9   Accu-Chek Guide test strip Generic drug: glucose blood Test once daily for glucose control.  Dx E11.9   Accu-Chek Softclix Lancets lancets Use once daily Dx E11.9   allopurinol  100 MG tablet Commonly known as: ZYLOPRIM  TAKE 1 TABLET BY MOUTH EVERY DAY   aspirin 81 MG chewable tablet Chew by mouth daily.   atenolol  50 MG tablet Commonly known as: TENORMIN  TAKE 1 TABLET BY MOUTH EVERY DAY   atorvastatin  20 MG tablet Commonly known as: LIPITOR TAKE 1 TABLET BY MOUTH EVERY DAY   colchicine  0.6 MG tablet Take 0.5 tablets (0.3 mg total) by mouth daily as needed (gout or psuedogout pain).   cycloSPORINE 0.05 % ophthalmic emulsion Commonly known as: RESTASIS 1 drop 2 (two) times daily.   esomeprazole 20 MG capsule Commonly known as: NEXIUM Take 20 mg by mouth daily at 6 (six) AM.   glipiZIDE  5 MG tablet Commonly known as: GLUCOTROL  TAKE 0.5 TABLETS (2.5 MG TOTAL) BY MOUTH 2 (TWO) TIMES DAILY BEFORE A MEAL. What changed: See the new instructions.   hydrochlorothiazide  25 MG tablet Commonly known as: HYDRODIURIL  TAKE 1 TABLET (25 MG TOTAL) BY MOUTH DAILY.   HYDROcodone -acetaminophen  5-325 MG tablet Commonly known as: NORCO/VICODIN Take 1 tablet by mouth every 6 (six) hours as needed.   metFORMIN  500 MG tablet Commonly known as: GLUCOPHAGE  Take 1 tablet (500 mg total) by mouth 2 (two) times daily with a meal.   multivitamin capsule Take 1 capsule by mouth daily.   tirzepatide  5 MG/0.5ML Pen Commonly known as:  MOUNJARO  Inject 5 mg into the skin once a week.   traZODone  150 MG tablet Commonly known as: DESYREL  TAKE 1/2 TABLET BY MOUTH EVERY EVENING   vitamin E 1000 UNIT capsule Take 1,000 Units by mouth daily.         OBJECTIVE:   Vital Signs: Ht 5' 2 (1.575 m)   Wt 233 lb (105.7 kg)   BMI 42.62 kg/m   Wt Readings from Last 3 Encounters:  05/13/24 233 lb (105.7 kg)  05/05/24 236 lb 9.6 oz (107.3  kg)  02/12/24 239 lb (108.4 kg)     Exam: General: Pt appears well and is in NAD  Lungs: Clear with good BS bilat   Heart: RRR   Extremities: Trace  pretibial edema.   Neuro: MS is good with appropriate affect, pt is alert and Ox3   DM foot exam: 05/13/2024   The skin of the feet is intact without sores or ulcerations. The pedal pulses are 2+ on right and 2+ on left. The sensation is intact to a screening 5.07, 10 gram monofilament bilaterally     DATA REVIEWED:  Lab Results  Component Value Date   HGBA1C 7.1 (A) 05/13/2024   HGBA1C 7.6 (A) 01/01/2024   HGBA1C 7.0 (A) 07/03/2023     Latest Reference Range & Units 05/08/24 12:50  Sodium 135 - 145 mEq/L 138  Potassium 3.5 - 5.1 mEq/L 4.4  Chloride 96 - 112 mEq/L 100  CO2 19 - 32 mEq/L 28  Glucose 70 - 99 mg/dL 887 (H)  BUN 6 - 23 mg/dL 22  Creatinine 9.59 - 8.79 mg/dL 8.75 (H)  Calcium  8.4 - 10.5 mg/dL 89.7  Alkaline Phosphatase 39 - 117 U/L 90  Albumin 3.5 - 5.2 g/dL 4.1  AST 0 - 37 U/L 19  ALT 0 - 35 U/L 21  Total Protein 6.0 - 8.3 g/dL 6.7  Total Bilirubin 0.2 - 1.2 mg/dL 0.6  GFR >39.99 mL/min 41.13 (L)  (H): Data is abnormally high (L): Data is abnormally low    ASSESSMENT / PLAN / RECOMMENDATIONS:   1) Type 2 Diabetes Mellitus,optimally controlled, With CKD III and retinopathy complications - Most recent A1c of 7.1 %. Goal A1c < 7.0 %.    -A1c is bending down -No glucose data today -Intolerant to Ozempic  -Due to a GFR < 45, decreased metformin  by 50% as below - Tolerating Mounjaro  will increase -  I have attempted to prescribe SGLT2 inhibitors, she was provided patient assistance forms in the past but these were not brought back  MEDICATIONS: Continue glipizide  5 mg daily  Continue  metformin  500 mg, 1 tablets twice daily Increase Mounjaro  7.5 mg weekly  EDUCATION / INSTRUCTIONS: BG monitoring instructions: Patient is instructed to check her blood sugars 1 times a day, fsting Call [facility] if: BG persistently < 70  I reviewed the Rule of 15 for the treatment of hypoglycemia in detail with the patient. Literature supplied.   2) Diabetic complications:  Eye: Does have known diabetic retinopathy.  Neuro/ Feet: Does not have known diabetic peripheral neuropathy .  Renal: Patient does have known baseline CKD. She   is intolerant to  ACEI/ARB     F/U in 6 months   Signed electronically by: Stefano Redgie Butts, MD  [facility]  [facility] [address] [place], [place] [address] Phone: [phone] FAX: [phone]   CC: Theophilus Andrews, Tully GRADE, MD [address] [place] [address] Phone: [phone]  Fax: [phone]  Return to Endocrinology clinic as below: No future appointments.

## 2024-05-13 NOTE — Patient Instructions (Signed)
-   Increase Mounjaro  7.5  mg weekly  - Continue  Glipizide  5 mg, 1 tablet before Breakfast  - Continue Metformin  500 mg ,1 tablets with Breakfast and 1 tablet with supper     HOW TO TREAT LOW BLOOD SUGARS (Blood sugar LESS THAN 70 MG/DL) Please follow the RULE OF 15 for the treatment of hypoglycemia treatment (when your (blood sugars are less than 70 mg/dL)   STEP 1: Take 15 grams of carbohydrates when your blood sugar is low, which includes:  3-4 GLUCOSE TABS  OR 3-4 OZ OF JUICE OR REGULAR SODA OR ONE TUBE OF GLUCOSE GEL    STEP 2: RECHECK blood sugar in 15 MINUTES STEP 3: If your blood sugar is still low at the 15 minute recheck --> then, go back to STEP 1 and treat AGAIN with another 15 grams of carbohydrates.

## 2024-05-14 ENCOUNTER — Ambulatory Visit: Payer: Self-pay | Admitting: Internal Medicine

## 2024-05-14 LAB — MICROALBUMIN / CREATININE URINE RATIO
Creatinine, Urine: 119 mg/dL (ref 20–275)
Microalb Creat Ratio: 8 mg/g{creat} (ref <30)
Microalb, Ur: 1 mg/dL

## 2024-05-26 ENCOUNTER — Other Ambulatory Visit: Payer: Self-pay | Admitting: Internal Medicine

## 2024-05-26 DIAGNOSIS — Z1231 Encounter for screening mammogram for malignant neoplasm of breast: Secondary | ICD-10-CM

## 2024-06-04 DIAGNOSIS — I129 Hypertensive chronic kidney disease with stage 1 through stage 4 chronic kidney disease, or unspecified chronic kidney disease: Secondary | ICD-10-CM | POA: Diagnosis not present

## 2024-06-04 DIAGNOSIS — N1832 Chronic kidney disease, stage 3b: Secondary | ICD-10-CM | POA: Diagnosis not present

## 2024-06-04 DIAGNOSIS — E1122 Type 2 diabetes mellitus with diabetic chronic kidney disease: Secondary | ICD-10-CM | POA: Diagnosis not present

## 2024-06-08 ENCOUNTER — Other Ambulatory Visit: Payer: Self-pay | Admitting: Internal Medicine

## 2024-06-17 DIAGNOSIS — N1832 Chronic kidney disease, stage 3b: Secondary | ICD-10-CM | POA: Diagnosis not present

## 2024-06-21 ENCOUNTER — Other Ambulatory Visit: Payer: Self-pay | Admitting: Internal Medicine

## 2024-06-21 DIAGNOSIS — I10 Essential (primary) hypertension: Secondary | ICD-10-CM

## 2024-06-23 ENCOUNTER — Ambulatory Visit
Admission: RE | Admit: 2024-06-23 | Discharge: 2024-06-23 | Disposition: A | Discharge status: Home / Self Care | Source: Ambulatory Visit | Attending: Internal Medicine | Admitting: Internal Medicine

## 2024-06-23 DIAGNOSIS — Z1231 Encounter for screening mammogram for malignant neoplasm of breast: Secondary | ICD-10-CM

## 2024-06-24 DIAGNOSIS — L570 Actinic keratosis: Secondary | ICD-10-CM | POA: Diagnosis not present

## 2024-06-24 DIAGNOSIS — L814 Other melanin hyperpigmentation: Secondary | ICD-10-CM | POA: Diagnosis not present

## 2024-06-24 DIAGNOSIS — Z85828 Personal history of other malignant neoplasm of skin: Secondary | ICD-10-CM | POA: Diagnosis not present

## 2024-06-24 DIAGNOSIS — D225 Melanocytic nevi of trunk: Secondary | ICD-10-CM | POA: Diagnosis not present

## 2024-06-24 DIAGNOSIS — L821 Other seborrheic keratosis: Secondary | ICD-10-CM | POA: Diagnosis not present

## 2024-07-08 ENCOUNTER — Other Ambulatory Visit: Payer: Self-pay | Admitting: Family Medicine

## 2024-07-08 NOTE — Telephone Encounter (Signed)
 Last OV 02/12/24 Next OV not scheduled  Last refill 01/05/34 Qty #90/1

## 2024-08-06 ENCOUNTER — Other Ambulatory Visit: Payer: Self-pay | Admitting: Internal Medicine

## 2024-08-31 ENCOUNTER — Ambulatory Visit: Payer: Self-pay

## 2024-08-31 NOTE — Telephone Encounter (Signed)
 noted

## 2024-08-31 NOTE — Telephone Encounter (Signed)
 FYI Only or Action Required?: FYI only for provider: appointment scheduled on 09/01/2024 at 11 AM.  Patient was last seen in primary care on 05/05/2024 by Theophilus Andrews, Tully GRADE, MD.  Called Nurse Triage reporting Back Pain.  Symptoms began about a month ago.  Interventions attempted: Rest, hydration, or home remedies.  Symptoms are: unchanged.  Triage Disposition: See PCP When Office is Open (Within 3 Days)  Patient/caregiver understands and will follow disposition?: Yes  Copied from CRM [id number]. Topic: Clinical - Red Word Triage >> Aug 31, 2024  2:32 PM Roselie BROCKS wrote: Red Word that prompted transfer to Nurse Triage: Patient states she is having  sharp pulling pain on mainly right side of back, sometimes its both sides ,but constant right side Reason for Disposition  [1] MODERATE back pain (e.g., interferes with normal activities) AND [2] present > 3 days  Answer Assessment - Initial Assessment Questions Patient reports pain to the sides of her back at the waistline for over a month. Patient endorses pain is intermittent and will come on quickly but then go away quickly. Patient is concerned that the pain has been going on for a long period of time. Patient is scheduled to see PCP tomorrow at 11 AM  1. ONSET: When did the pain begin? (e.g., minutes, hours, days)     Started about a month ago 2. LOCATION: Where does it hurt? (upper, mid or lower back)     Lower back 3. SEVERITY: How bad is the pain?  (e.g., Scale 1-10; mild, moderate, or severe)     No pain currently but when pain happens is sharp 4. PATTERN: Is the pain constant? (e.g., yes, no; constant, intermittent)      intermittent 5. RADIATION: Does the pain shoot into your legs or somewhere else?     Goes from side to side 6. CAUSE:  What do you think is causing the back pain?      unsure 7. BACK OVERUSE:  Any recent lifting of heavy objects, strenuous work or exercise?     no 8. MEDICINES: What  have you taken so far for the pain? (e.g., nothing, acetaminophen , NSAIDS)     no 9. NEUROLOGIC SYMPTOMS: Do you have any weakness, numbness, or problems with bowel/bladder control?     no 10. OTHER SYMPTOMS: Do you have any other symptoms? (e.g., fever, abdomen pain, burning with urination, blood in urine)       no  Protocols used: Back Pain-A-AH

## 2024-09-01 ENCOUNTER — Encounter: Payer: Self-pay | Admitting: Internal Medicine

## 2024-09-01 ENCOUNTER — Encounter: Admitting: Internal Medicine

## 2024-09-01 NOTE — Progress Notes (Signed)
 This encounter was created in error - please disregard.

## 2024-09-02 ENCOUNTER — Ambulatory Visit (INDEPENDENT_AMBULATORY_CARE_PROVIDER_SITE_OTHER)

## 2024-09-02 ENCOUNTER — Ambulatory Visit: Admitting: Family Medicine

## 2024-09-02 ENCOUNTER — Other Ambulatory Visit: Payer: Self-pay

## 2024-09-02 VITALS — BP 128/82 | HR 66 | Ht 62.0 in

## 2024-09-02 DIAGNOSIS — G8929 Other chronic pain: Secondary | ICD-10-CM

## 2024-09-02 DIAGNOSIS — M25561 Pain in right knee: Secondary | ICD-10-CM | POA: Diagnosis not present

## 2024-09-02 DIAGNOSIS — M1711 Unilateral primary osteoarthritis, right knee: Secondary | ICD-10-CM | POA: Diagnosis not present

## 2024-09-02 NOTE — Progress Notes (Signed)
   LILLETTE Ileana Collet, PhD, LAT, ATC acting as a scribe for Artist Lloyd, MD.  Diane Decker is a 78 y.o. female who presents to [facility] today for R knee pain. Pt was previously seen by Dr. Lloyd on 02/12/24 for her L knee.   Today, pt c/o R knee pain x 1-wk. She notes R knee is giving out when walking. Pt locates pain to the medial aspect of her R knee.   R Knee swelling: slight Mechanical symptoms: no Aggravates: walking, transitioning to stand, stairs,  Treatments tried: walking w/ a cane, IBU, walking w/ caution  Dx testing: 11/07/23 DEXA  Pertinent review of systems: No fevers or chills  Relevant historical information: Hypertension and diabetes   Exam:  BP 128/82   Pulse 66   Ht 5' 2 (1.575 m)   SpO2 97%   BMI 43.84 kg/m  General: Well Developed, well nourished, and in no acute distress.   MSK: Right knee tender palpation medial joint line normal knee motion.  Some laxity with MCL stress test.  Intact strength.    Lab and Radiology Results  Procedure: Real-time Ultrasound Guided Injection of right knee joint superior lateral patella space Device: Philips Affiniti 50G/GE Logiq Images permanently stored and available for review in PACS Verbal informed consent obtained.  Discussed risks and benefits of procedure. Warned about infection, bleeding, hyperglycemia damage to structures among others. Patient expresses understanding and agreement Time-out conducted.   Noted no overlying erythema, induration, or other signs of local infection.   Skin prepped in a sterile fashion.   Local anesthesia: Topical Ethyl chloride.   With sterile technique and under real time ultrasound guidance: 40 mg of Kenalog and 2 mL of Marcaine injected into knee joint. Fluid seen entering the joint capsule.   Completed without difficulty   Pain immediately resolved suggesting accurate placement of the medication.   Advised to call if  fevers/chills, erythema, induration, drainage, or persistent bleeding.   Images permanently stored and available for review in the ultrasound unit.  Impression: Technically successful ultrasound guided injection.    Images right knee obtained today personally and independently interpreted. Moderate to severe medial DJD.  No acute fracture Await formal radiology review    Assessment and Plan: 78 y.o. female with right knee pain due to exacerbation of DJD.  Plan for steroid injection today along with trial of topical diclofenac gel.  Recheck as needed.  PDMP not reviewed this encounter. Orders Placed This Encounter  Procedures   US  LIMITED JOINT SPACE STRUCTURES LOW RIGHT(NO LINKED CHARGES)    Reason for Exam (SYMPTOM  OR DIAGNOSIS REQUIRED):   right knee pain    Preferred imaging location?:   [facility] Knee AP/LAT W/Sunrise Right    Standing Status:   Future    Number of Occurrences:   1    Expiration Date:   10/02/2024    Reason for Exam (SYMPTOM  OR DIAGNOSIS REQUIRED):   right knee pain    Preferred imaging location?:   [facility]   No orders of the defined types were placed in this encounter.    Discussed warning signs or symptoms. Please see discharge instructions. Patient expresses understanding.   The above documentation has been reviewed and is accurate and complete Artist Lloyd, M.D.

## 2024-09-02 NOTE — Patient Instructions (Addendum)
 Thank you for coming in today.   You received an injection today. Seek immediate medical attention if the joint becomes red, extremely painful, or is oozing fluid.   Please use Voltaren gel (Generic Diclofenac Gel) up to 4x daily for pain as needed.  This is available over-the-counter as both the name brand Voltaren gel and the generic diclofenac gel.   Check back as needed

## 2024-09-04 ENCOUNTER — Ambulatory Visit: Payer: Self-pay | Admitting: Family Medicine

## 2024-09-04 NOTE — Progress Notes (Signed)
Right knee x-ray shows medium arthritis

## 2024-10-23 ENCOUNTER — Encounter: Payer: Self-pay | Admitting: Family Medicine

## 2024-10-23 ENCOUNTER — Telehealth: Payer: Self-pay | Admitting: Family Medicine

## 2024-10-23 ENCOUNTER — Ambulatory Visit

## 2024-10-23 ENCOUNTER — Ambulatory Visit: Admitting: Family Medicine

## 2024-10-23 ENCOUNTER — Other Ambulatory Visit: Payer: Self-pay

## 2024-10-23 VITALS — BP 124/76 | HR 76 | Ht 62.0 in

## 2024-10-23 DIAGNOSIS — M25561 Pain in right knee: Secondary | ICD-10-CM

## 2024-10-23 DIAGNOSIS — G8929 Other chronic pain: Secondary | ICD-10-CM

## 2024-10-23 NOTE — Patient Instructions (Addendum)
 Thank you for coming in today.   We've placed an order for a MRI of the right knee. Someone from [facility] will reach out to assist with scheduling after obtaining prior authorization from your insurance company.   Aleck from [facility] will reach out about getting you set up with a Medial Off-loader Knee Brace.   Recommend using a walker.   Let me know if you need a prescription to help with the pain.   See you back for MRI review.

## 2024-10-23 NOTE — Telephone Encounter (Signed)
 Dr. Joane is happy to see the pt at the end of our morning, if she is able to get to the office. Other option is we can order the xray and she can stop by the office at any point today.

## 2024-10-23 NOTE — Telephone Encounter (Signed)
 Patient scheduled for today at 11:30

## 2024-10-23 NOTE — Telephone Encounter (Signed)
 Patient called stating that she fell two nights ago on her bad knee.She said that since then, she is having a lot of pain in her knee and cannot bear weight.  She is having to use a walker to get around.  She is wanting to see Dr Joane and have an xray. I advised that he does not have any openings.   Please advise.

## 2024-10-23 NOTE — Progress Notes (Signed)
 "        I, Leotis Batter, CMA acting as a scribe for Artist Lloyd, MD.  Diane Decker is a 79 y.o. female who presents to [facility] at [facility] today for knee pain. Pt was last seen by Dr. Lloyd on 09/02/24 and was given a R knee steroid injection.  Today, pt c/o worsened R knee pain after suffering a fall 2 nights ago. Pt was walking from the kitchen to the living room and the knee suddenly gave out. She was able to catch herself on the recliner. Unable to weight-bear. Ambulating with a wheelchair today. Locates pain to medial aspect of the knee, TTP. Feel that the knee moves in an odd direction when standing. Denies visible swelling, has been using ice. Swelling was present at time of injury. Taking IBU with minimal relief. Some pain at posterior aspect of the knee, pulling sensation.   Dx testing: 09/02/24 R knee XR 11/07/23 DEXA   Pertinent review of systems: No fevers or chills  Relevant historical information: Hypertension and diabetes.   Exam:  BP 124/76   Pulse 76   Ht 5' 2 (1.575 m)   SpO2 97%   BMI 43.84 kg/m  General: Well Developed, well nourished, and in no acute distress.   MSK: Right knee mild effusion normal-appearing otherwise normal motion.  Tender palpation medial joint line.  Mild laxity present with MCL stress test.    Lab and Radiology Results  X-ray images right knee obtained today personally and independently interpreted. Significant medial DJD.  Lucency present at medial femoral condyle.  No acute fractures are visible Await formal radiology review    Assessment and Plan: 79 y.o. female with right knee pain with acute exacerbation.  X-ray does show degenerative changes but does not explain her pain well enough.  Plan for MRI to further characterize source of knee pain and a medial offloader knee brace.  Anticipate recheck after MRI.   PDMP reviewed during this encounter. Orders Placed This Encounter  Procedures   DG  Knee AP/LAT W/Sunrise Right    Standing Status:   Future    Number of Occurrences:   1    Expiration Date:   11/23/2024    Reason for Exam (SYMPTOM  OR DIAGNOSIS REQUIRED):   right knee pain    Preferred imaging location?:   [facility]   US  LIMITED JOINT SPACE STRUCTURES LOW RIGHT(NO LINKED CHARGES)    Reason for Exam (SYMPTOM  OR DIAGNOSIS REQUIRED):   right knee pain    Preferred imaging location?:   [facility]   MR KNEE RIGHT WO CONTRAST    HU[facility] [id number] Epic Order Wt: 222 Ht: 5'2 Yes NeedsWalker/No claus/No metal removed from eyes/No bullets or bbs/No implants/No brain, heart, eyes or ears sx/Yes CATS sx/No iron fusion therapy/Not in kidney failure and not on dialysis/NKDA to IV contrast Aware to bring photo ID and ins card Aware of $75 no show fee Spoke w pt 10/23/2024 SW    Standing Status:   Future    Number of Occurrences:   1    Expiration Date:   10/23/2025    What is the patient's sedation requirement?:   No Sedation    Does the patient have a pacemaker or implanted devices?:   No    Preferred imaging location?:   [facility] (table limit-550lbs)   No orders of the defined types were placed in this encounter.    Discussed warning  signs or symptoms. Please see discharge instructions. Patient expresses understanding.   The above documentation has been reviewed and is accurate and complete Artist Lloyd, M.D.   "

## 2024-10-24 ENCOUNTER — Ambulatory Visit
Admission: RE | Admit: 2024-10-24 | Discharge: 2024-10-24 | Disposition: A | Source: Ambulatory Visit | Attending: Family Medicine | Admitting: Family Medicine

## 2024-10-24 DIAGNOSIS — G8929 Other chronic pain: Secondary | ICD-10-CM

## 2024-10-28 ENCOUNTER — Telehealth: Payer: Self-pay | Admitting: Family Medicine

## 2024-10-28 NOTE — Telephone Encounter (Signed)
 Already handled this, Dr. Virgilio Athletic Trainer will look at her leg while pt is here for her brace fitting and alert Dr. Joane if future appt needed to address.

## 2024-10-28 NOTE — Telephone Encounter (Signed)
 Patients daughter called as her mother was seen for her knee recently and did not have her foot looked at because she did not say anything about her foot. Her daughter has looked at her foot and she states it looks broken. Her mother says it does not hurt and with her mother having diabetes and saying it does not hurt she is very concerned. Her mother is coming to see Aleck to get a brace tomorrow but she states her mother is confused and thinks she is seeing Dr. Joane. Her daughter asked if a healthcare professional at all is able to look at her mothers foot while she is here tomorrow if she can not get an appt for tomorrow. Please advise/ FYI

## 2024-10-31 ENCOUNTER — Other Ambulatory Visit: Payer: Self-pay | Admitting: Internal Medicine

## 2024-11-02 ENCOUNTER — Ambulatory Visit: Payer: Self-pay | Admitting: Family Medicine

## 2024-11-02 NOTE — Progress Notes (Signed)
 Right knee x-ray shows severe arthritis on the medial aspect of the knee.

## 2024-11-06 NOTE — Progress Notes (Signed)
 Right knee MRI shows severe arthritis at the medial or inside part of the knee.  At the same location there is a meniscus tear which could be also producing pain.  Elsewhere in the knee there is arthritis and the muscles that support the knee are atrophied and weak as well.  Recommend continuing the knee brace and schedule an appointment with me in the near future.

## 2024-11-13 ENCOUNTER — Ambulatory Visit: Admitting: Internal Medicine

## 2024-11-13 ENCOUNTER — Encounter: Payer: Self-pay | Admitting: Internal Medicine

## 2024-11-13 VITALS — BP 150/82 | Ht 62.0 in | Wt 233.0 lb

## 2024-11-13 DIAGNOSIS — N1831 Chronic kidney disease, stage 3a: Secondary | ICD-10-CM

## 2024-11-13 DIAGNOSIS — Z7985 Long-term (current) use of injectable non-insulin antidiabetic drugs: Secondary | ICD-10-CM | POA: Diagnosis not present

## 2024-11-13 DIAGNOSIS — N183 Chronic kidney disease, stage 3 unspecified: Secondary | ICD-10-CM | POA: Diagnosis not present

## 2024-11-13 DIAGNOSIS — E113299 Type 2 diabetes mellitus with mild nonproliferative diabetic retinopathy without macular edema, unspecified eye: Secondary | ICD-10-CM

## 2024-11-13 DIAGNOSIS — Z7984 Long term (current) use of oral hypoglycemic drugs: Secondary | ICD-10-CM | POA: Diagnosis not present

## 2024-11-13 DIAGNOSIS — E1122 Type 2 diabetes mellitus with diabetic chronic kidney disease: Secondary | ICD-10-CM

## 2024-11-13 DIAGNOSIS — E1165 Type 2 diabetes mellitus with hyperglycemia: Secondary | ICD-10-CM | POA: Diagnosis not present

## 2024-11-13 LAB — POCT GLYCOSYLATED HEMOGLOBIN (HGB A1C): Hemoglobin A1C: 6.5 % — AB (ref 4.0–5.6)

## 2024-11-13 MED ORDER — TIRZEPATIDE 7.5 MG/0.5ML ~~LOC~~ SOAJ: 7.5000 mg | SUBCUTANEOUS | 3 refills | Status: AC

## 2024-11-13 MED ORDER — METFORMIN HCL 500 MG PO TABS: 500.0000 mg | ORAL_TABLET | Freq: Two times a day (BID) | ORAL | 3 refills | Status: AC

## 2024-11-13 MED ORDER — GLIPIZIDE 5 MG PO TABS: 5.0000 mg | ORAL_TABLET | Freq: Every day | ORAL | 3 refills | Status: AC

## 2024-11-13 NOTE — Patient Instructions (Addendum)
-   Continue Mounjaro  7.5  mg weekly  - Continue  Glipizide  5 mg, 1 tablet before the first meal of the day - Continue Metformin  500 mg ,1 tablets with Breakfast and 1 tablet with supper     HOW TO TREAT LOW BLOOD SUGARS (Blood sugar LESS THAN 70 MG/DL) Please follow the RULE OF 15 for the treatment of hypoglycemia treatment (when your (blood sugars are less than 70 mg/dL)   STEP 1: Take 15 grams of carbohydrates when your blood sugar is low, which includes:  3-4 GLUCOSE TABS  OR 3-4 OZ OF JUICE OR REGULAR SODA OR ONE TUBE OF GLUCOSE GEL    STEP 2: RECHECK blood sugar in 15 MINUTES STEP 3: If your blood sugar is still low at the 15 minute recheck --> then, go back to STEP 1 and treat AGAIN with another 15 grams of carbohydrates.

## 2024-11-13 NOTE — Progress Notes (Signed)
 "   Name: Diane Decker  Age/ Sex: 79 y.o., female   MRN/ DOB: [id number], 1946-07-16     PCP: Theophilus Andrews, Tully GRADE, MD   Reason for Endocrinology Evaluation: Type 2 Diabetes Mellitus  Initial Endocrine Consultative Visit: 11/10/2020    PATIENT IDENTIFIER: Diane Decker is a 79 y.o. female with a past medical history of T2DM, barrett's esophagus. The patient has followed with Endocrinology clinic since 11/10/2020 for consultative assistance with management of her diabetes.  DIABETIC HISTORY:  Diane Decker was diagnosed with DM at age 34, ozempic  caused GI side effects. . Her hemoglobin A1c has ranged from 6.6%  in 2021, peaking at 9.7 %in 2021.    On her initial visit to our clinic her A1c was 6.6 % she was on Glipizide  XL, Metformin  and wanted to give ozempic  another try. We swiched XL glipizide  to regular release   Started Jardiance  06/2023 but was cost prohibitive  SUBJECTIVE:   During the last visit (05/13/2024): A1c 7.1%    Today (11/13/2024): Diane Decker  is here for a follow up on diabetes management.   She has been not been checking glucose at home, denies symptoms of hypoglycemia   Weight remains stable Her right knee  gave out  and is having pain , pending a brace order ,has been using a walker .  Follows with sports medicine, had received intra-articular injection No nausea  No constipation or diarrhea     HOME DIABETES REGIMEN:  Glipizide  5 mg,1 tab every morning  Metformin  500 mg , 1 tablets BID Mounjaro  7.5 mg once weekly     Statin: yes  ACE-I/ARB: Intolerant to lisinopril and losartan    CONTINUOUS GLUCOSE MONITORING RECORD INTERPRETATION : n/a    DIABETIC COMPLICATIONS: Microvascular complications:  CKD III, mild retinopathy Denies: neuropathy  Last Eye Exam: Completed 12/31/2023  Macrovascular complications:   Denies: CAD, CVA, PVD   HISTORY:  Past Medical History:  Past Medical History:  Diagnosis  Date   Allergy    Arthritis    Barrett's esophagus    Cancer (HCC)    skin cancer   Chicken pox    Depression    Diabetes mellitus without complication (HCC)    Elevated LFTs    Family history of polyps in the colon    GERD (gastroesophageal reflux disease)    Gout    Hypertension    Osteopenia    Sleep apnea    borderline- no cpap use    Past Surgical History:  Past Surgical History:  Procedure Laterality Date   ABDOMINAL HYSTERECTOMY     BREAST BIOPSY  2007   COLONOSCOPY     KNEE ARTHROSCOPY Left    ~15 yrs ago    POLYPECTOMY     TONSILLECTOMY  1949   UPPER GASTROINTESTINAL ENDOSCOPY     Social History:  reports that she has quit smoking. She has never used smokeless tobacco. She reports that she does not drink alcohol and does not use drugs. Family History:  Family History  Problem Relation Age of Onset   Breast cancer Mother    Lung cancer Father    Liver cancer Father    Arthritis Sister    Depression Sister    Hyperlipidemia Sister    Hypertension Sister    Alcohol abuse Brother    Arthritis Brother    Depression Brother    Hyperlipidemia Brother    Hypertension Brother    Cancer Daughter    COPD  Daughter    Depression Son    Arthritis Maternal Grandmother    Depression Maternal Grandmother    Hyperlipidemia Maternal Grandmother    Hypertension Maternal Grandmother    Stroke Maternal Grandmother    Alcohol abuse Maternal Grandfather    Arthritis Maternal Grandfather    Cancer Maternal Grandfather    Hyperlipidemia Maternal Grandfather    Hypertension Maternal Grandfather    Stroke Maternal Grandfather    Arthritis Paternal Grandmother    Hyperlipidemia Paternal Grandmother    Hypertension Paternal Grandmother    Arthritis Paternal Grandfather    Alcohol abuse Paternal Grandfather    Diabetes Paternal Grandfather    Hyperlipidemia Paternal Grandfather    Heart disease Paternal Grandfather    Hearing loss Paternal Grandfather    Stroke Paternal  Grandfather    Colon polyps Neg Hx    Esophageal cancer Neg Hx    Rectal cancer Neg Hx    Stomach cancer Neg Hx      HOME MEDICATIONS: Allergies as of 11/13/2024       Reactions   Penicillins Anaphylaxis   Whey Protein [protein] Shortness Of Breath   Lisinopril Other (See Comments), Swelling   Losartan Potassium Swelling   Losartan Nausea And Vomiting        Medication List        Accurate as of November 13, 2024  1:58 PM. If you have any questions, ask your nurse or doctor.          Accu-Chek Guide Me w/Device Kit Use daily for glucose control . Dx E11.9   Accu-Chek Guide test strip Generic drug: glucose blood Test once daily for glucose control.  Dx E11.9   Accu-Chek Softclix Lancets lancets Use once daily Dx E11.9   allopurinol  100 MG tablet Commonly known as: ZYLOPRIM  TAKE 1 TABLET BY MOUTH EVERY DAY   aspirin 81 MG chewable tablet Chew by mouth daily.   atenolol  50 MG tablet Commonly known as: TENORMIN  TAKE 1 TABLET BY MOUTH EVERY DAY   atorvastatin  20 MG tablet Commonly known as: LIPITOR TAKE 1 TABLET BY MOUTH EVERY DAY   colchicine  0.6 MG tablet Take 0.5 tablets (0.3 mg total) by mouth daily as needed (gout or psuedogout pain).   cycloSPORINE 0.05 % ophthalmic emulsion Commonly known as: RESTASIS 1 drop 2 (two) times daily.   esomeprazole 20 MG capsule Commonly known as: NEXIUM Take 20 mg by mouth daily at 6 (six) AM.   glipiZIDE  5 MG tablet Commonly known as: GLUCOTROL  Take 1 tablet (5 mg total) by mouth daily before breakfast.   hydrochlorothiazide  25 MG tablet Commonly known as: HYDRODIURIL  TAKE 1 TABLET (25 MG TOTAL) BY MOUTH DAILY.   HYDROcodone -acetaminophen  5-325 MG tablet Commonly known as: NORCO/VICODIN Take 1 tablet by mouth every 6 (six) hours as needed.   metFORMIN  500 MG tablet Commonly known as: GLUCOPHAGE  Take 1 tablet (500 mg total) by mouth 2 (two) times daily with a meal.   multivitamin capsule Take 1 capsule  by mouth daily.   tirzepatide  7.5 MG/0.5ML Pen Commonly known as: MOUNJARO  Inject 7.5 mg into the skin once a week.   traZODone  150 MG tablet Commonly known as: DESYREL  TAKE 1/2 TABLET BY MOUTH EVERY EVENING   vitamin E 1000 UNIT capsule Take 1,000 Units by mouth daily.         OBJECTIVE:   Vital Signs: BP (!) 150/82   Ht 5' 2 (1.575 m)   Wt 233 lb (105.7 kg)   BMI 42.62  kg/m   Wt Readings from Last 3 Encounters:  11/13/24 233 lb (105.7 kg)  09/01/24 239 lb 11.2 oz (108.7 kg)  05/13/24 233 lb (105.7 kg)     Exam: General: Pt appears well and is in NAD  Lungs: Clear with good BS bilat   Heart: RRR   Extremities: Trace  pretibial edema.   Neuro: MS is good with appropriate affect, pt is alert and Ox3   DM foot exam: 05/13/2024   The skin of the feet is intact without sores or ulcerations. The pedal pulses are 2+ on right and 2+ on left. The sensation is intact to a screening 5.07, 10 gram monofilament bilaterally     DATA REVIEWED:  Lab Results  Component Value Date   HGBA1C 7.1 (A) 05/13/2024   HGBA1C 7.6 (A) 01/01/2024   HGBA1C 7.0 (A) 07/03/2023     Latest Reference Range & Units 05/08/24 12:50  Sodium 135 - 145 mEq/L 138  Potassium 3.5 - 5.1 mEq/L 4.4  Chloride 96 - 112 mEq/L 100  CO2 19 - 32 mEq/L 28  Glucose 70 - 99 mg/dL 887 (H)  BUN 6 - 23 mg/dL 22  Creatinine 9.59 - 8.79 mg/dL 8.75 (H)  Calcium  8.4 - 10.5 mg/dL 89.7  Alkaline Phosphatase 39 - 117 U/L 90  Albumin 3.5 - 5.2 g/dL 4.1  AST 0 - 37 U/L 19  ALT 0 - 35 U/L 21  Total Protein 6.0 - 8.3 g/dL 6.7  Total Bilirubin 0.2 - 1.2 mg/dL 0.6  GFR >39.99 mL/min 41.13 (L)    ASSESSMENT / PLAN / RECOMMENDATIONS:   1) Type 2 Diabetes Mellitus,optimally controlled, With CKD III and retinopathy complications - Most recent A1c of 6.5 %. Goal A1c < 7.0 %.    -A1c is optimal -She has not been checking glucose at home, I did encourage glucose checks to make sure she is not getting hypoglycemic  episodes -Intolerant to Ozempic  -Due to a GFR < 45, decreased metformin  by 50% as below - I am stressed the importance of taking glipizide  before the first meal of the day, to prevent hypoglycemia, as she has not been consistent with eating breakfast every day - We did not change her Mounjaro , as she is already eating just 1 meal a day  MEDICATIONS: Continue glipizide  5 mg daily  Continue  metformin  500 mg, 1 tablets twice daily Continue Mounjaro  7.5 mg weekly  EDUCATION / INSTRUCTIONS: BG monitoring instructions: Patient is instructed to check her blood sugars 1 times a day, fsting Call [place] if: BG persistently < 70  I reviewed the Rule of 15 for the treatment of hypoglycemia in detail with the patient. Literature supplied.   2) Diabetic complications:  Eye: Does have known diabetic retinopathy.  Neuro/ Feet: Does not have known diabetic peripheral neuropathy .  Renal: Patient does have known baseline CKD. She   is intolerant to  ACEI/ARB    F/U in 6 months   Signed electronically by: Stefano Redgie Butts, MD  [facility] Endocrinology  [facility] [address] [place], [place] [address] Phone: [phone] FAX: [phone]   CC: Theophilus Andrews, Tully GRADE, MD [address] [place] [address] Phone: [phone]  Fax: [phone]  Return to Endocrinology clinic as below: Future Appointments  Date Time Provider Department Center  11/13/2024  2:00 PM Reality Dejonge, Donell Redgie, MD [facility] None  11/16/2024  1:15 PM Joane Artist RAMAN, MD [facility] None  01/15/2025  2:20 PM  [facility]AL WELLNESS VISIT [facility] [address]       "

## 2024-11-16 ENCOUNTER — Ambulatory Visit: Admitting: Family Medicine

## 2024-11-24 ENCOUNTER — Ambulatory Visit: Admitting: Family Medicine

## 2024-12-03 ENCOUNTER — Ambulatory Visit: Admitting: Family Medicine

## 2025-01-15 ENCOUNTER — Ambulatory Visit

## 2025-05-14 ENCOUNTER — Ambulatory Visit: Admitting: Internal Medicine
